# Patient Record
Sex: Male | Born: 2000 | Race: White | Hispanic: No
Health system: Southern US, Community
[De-identification: ages and names within clinical notes are randomized; demographics above are authoritative.]

## PROBLEM LIST (undated history)

## (undated) DIAGNOSIS — F209 Schizophrenia, unspecified: Secondary | ICD-10-CM

## (undated) DIAGNOSIS — F4320 Adjustment disorder, unspecified: Secondary | ICD-10-CM

## (undated) DIAGNOSIS — I469 Cardiac arrest, cause unspecified: Secondary | ICD-10-CM

## (undated) DIAGNOSIS — F1910 Other psychoactive substance abuse, uncomplicated: Secondary | ICD-10-CM

## (undated) HISTORY — PX: CIRCUMCISION: SUR203

---
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
## 2022-03-14 ENCOUNTER — Encounter (HOSPITAL_COMMUNITY): Payer: Self-pay | Admitting: Emergency Medicine

## 2022-03-14 ENCOUNTER — Other Ambulatory Visit: Payer: Self-pay

## 2022-03-14 ENCOUNTER — Emergency Department (HOSPITAL_COMMUNITY)
Admission: EM | Admit: 2022-03-14 | Discharge: 2022-03-14 | Disposition: A | Discharge status: Home / Self Care | Payer: Medicaid Other | Attending: Emergency Medicine | Admitting: Emergency Medicine

## 2022-03-14 DIAGNOSIS — F1910 Other psychoactive substance abuse, uncomplicated: Secondary | ICD-10-CM | POA: Diagnosis present

## 2022-03-14 NOTE — ED Triage Notes (Addendum)
Pt BIB EMS from [employer] stating that he used meth, cocaine, and heroin prior to calling for EMS. Pt states he wants detox from meth. Pt refused everything except transport to the hospital. Pt requesting food prior to triage being complete.

## 2022-03-14 NOTE — ED Provider Notes (Signed)
[facility] DEPT Provider Note   CSN: [id number] Arrival date & time: 03/14/22  0509     History  Chief Complaint  Patient presents with   Drug Problem    Nicholas Vega is a 21 y.o. male with a history of polysubstance use and depression who presents to the emergency department via EMS reporting that he has a drug problem.  When asked patient what brought him into the emergency department tonight he states that he utilized meth, crack, and heroin via injection.  He last used this prior to midnight.  He states he would like something to eat and was cold.  He has no additional complaints at this time.  No alleviating or aggravating factors.  He denies SI, HI, hallucinations, chest pain, shortness of breath, headache, or abdominal pain.  Per EMS they received a call from [employer] due to concern that patient was utilizing drugs, patient uncooperative in route.  HPI     Home Medications Prior to Admission medications   Medication Sig Start Date End Date Taking? Authorizing Provider  divalproex (DEPAKOTE ER) 500 MG 24 hr tablet Take 2 tablets (1,000 mg total) by mouth at bedtime. 07/30/21   Hill, Jackie Plum, MD  nicotine (NICODERM CQ - DOSED IN MG/24 HOURS) 21 mg/24hr patch Place 1 patch (21 mg total) onto the skin daily. 07/30/21   Maida Sale, MD  nicotine polacrilex (NICORETTE) 2 MG gum Take 1 each (2 mg total) by mouth as needed for smoking cessation. 07/30/21   Maida Sale, MD  OLANZapine (ZYPREXA) 10 MG tablet Take 10 mg by mouth at bedtime. Patient not taking: No sig reported 07/09/21   [provider]  thiamine 100 MG tablet Take 1 tablet (100 mg total) by mouth daily. 07/31/21   Maida Sale, MD  traZODone (DESYREL) 50 MG tablet Take 1 tablet (50 mg total) by mouth at bedtime as needed for sleep. 07/30/21   Maida Sale, MD      Allergies    Patient has no known allergies.    Review of Systems    Review of Systems  Constitutional:  Negative for chills and fever.  Respiratory:  Negative for shortness of breath.   Cardiovascular:  Negative for chest pain.  Gastrointestinal:  Negative for abdominal pain and vomiting.  Psychiatric/Behavioral:  Negative for hallucinations and suicidal ideas.   All other systems reviewed and are negative.   Physical Exam Updated Vital Signs BP (!) 129/58   Pulse (!) 59   Temp (!) 97.5 F (36.4 C)   Resp 18   Ht 5\' 8"  (1.727 m)   Wt 61.2 kg   SpO2 100%   BMI 20.53 kg/m  Physical Exam Vitals and nursing note reviewed.  Constitutional:      General: He is not in acute distress.    Appearance: He is well-developed. He is not toxic-appearing.  HENT:     Head: Normocephalic and atraumatic.  Eyes:     General:        Right eye: No discharge.        Left eye: No discharge.     Conjunctiva/sclera: Conjunctivae normal.  Cardiovascular:     Rate and Rhythm: Normal rate and regular rhythm.  Pulmonary:     Effort: No respiratory distress.     Breath sounds: Normal breath sounds. No wheezing or rales.  Abdominal:     General: There is no distension.     Palpations: Abdomen is soft.  Tenderness: There is no abdominal tenderness.  Musculoskeletal:     Cervical back: Neck supple.  Skin:    General: Skin is warm and dry.     Comments: Bruises and track marks present.  No obvious areas of increased erythema, warmth, purulence, induration or fluctuance.  Neurological:     Mental Status: He is alert.     Comments: Clear speech.   Psychiatric:        Thought Content: Thought content does not include homicidal or suicidal ideation.     Comments: Speech is mildly rapid however coherent and directed.  Patient does not appear to be responding to internal stimuli.     ED Results / Procedures / Treatments   Labs (all labs ordered are listed, but only abnormal results are displayed) Labs Reviewed - No data to display  EKG None  Radiology No  results found.  Procedures Procedures    Medications Ordered in ED Medications - No data to display  ED Course/ Medical Decision Making/ A&P                           Medical Decision Making  Patient presents to the emergency department via EMS requesting something to eat and reporting problems with substance use.  He is nontoxic, his vitals are not significantly abnormal, mildly bradycardic on arrival/intermittently however normal heart rate on my exam.  Temp 97.5, is coming from outside.  Patient has no specific complaints on exam.  He is not suicidal or homicidal, and does not appear to be responding to internal stimuli.  Does not appear acutely psychotic.  He has no specific physical complaints.  He has a relatively benign physical exam.  He was given the opportunity to take a nap under warm blankets in the emergency department with something to eat.  He overall appears appropriate for discharge.  Consulted TOC in order to try to coordinate peer support.  Resource guide provided in terms of detox. I discussed treatment plan, need for follow-up, and return precautions with the patient. Provided opportunity for questions, patient confirmed understanding and is in agreement with plan.    Final Clinical Impression(s) / ED Diagnoses Final diagnoses:  Polysubstance abuse [facility])    Rx / DC Orders ED Discharge Orders     None         Amaryllis Dyke, PA-C 03/14/22 [id number]    Shanon Rosser, MD 03/14/22 [phone]

## 2022-03-14 NOTE — Discharge Instructions (Signed)
Please follow-up with one of the outpatient resources in regards to your substance use.  Please try to avoid using drugs.  Follow-up with one of the resources as well as primary care, he did not have a primary care provider provided information for the [facility] health community wellness clinic.  Return to the emergency department for new or worsening symptoms or any other concerns.

## 2022-03-18 ENCOUNTER — Encounter (HOSPITAL_COMMUNITY): Payer: Self-pay

## 2022-03-18 ENCOUNTER — Emergency Department (EMERGENCY_DEPARTMENT_HOSPITAL)
Admission: EM | Admit: 2022-03-18 | Discharge: 2022-03-18 | Disposition: A | Discharge status: Other Acute Inpatient Hospital | Payer: Medicaid Other | Source: Home / Self Care | Attending: Emergency Medicine | Admitting: Emergency Medicine

## 2022-03-18 ENCOUNTER — Other Ambulatory Visit: Payer: Self-pay

## 2022-03-18 ENCOUNTER — Emergency Department (HOSPITAL_COMMUNITY)
Admission: EM | Admit: 2022-03-18 | Discharge: 2022-03-18 | Discharge status: Against Medical Advice | Payer: Medicaid Other | Attending: Emergency Medicine | Admitting: Emergency Medicine

## 2022-03-18 ENCOUNTER — Encounter (HOSPITAL_COMMUNITY): Payer: Self-pay | Admitting: Emergency Medicine

## 2022-03-18 ENCOUNTER — Ambulatory Visit (HOSPITAL_COMMUNITY)
Admission: EM | Admit: 2022-03-18 | Discharge: 2022-03-19 | Disposition: A | Discharge status: Home / Self Care | Payer: Medicaid Other | Source: Home / Self Care

## 2022-03-18 DIAGNOSIS — F1994 Other psychoactive substance use, unspecified with psychoactive substance-induced mood disorder: Secondary | ICD-10-CM | POA: Diagnosis present

## 2022-03-18 DIAGNOSIS — R45851 Suicidal ideations: Secondary | ICD-10-CM | POA: Insufficient documentation

## 2022-03-18 DIAGNOSIS — F1914 Other psychoactive substance abuse with psychoactive substance-induced mood disorder: Secondary | ICD-10-CM | POA: Insufficient documentation

## 2022-03-18 DIAGNOSIS — R451 Restlessness and agitation: Secondary | ICD-10-CM | POA: Diagnosis present

## 2022-03-18 DIAGNOSIS — M79673 Pain in unspecified foot: Secondary | ICD-10-CM | POA: Insufficient documentation

## 2022-03-18 DIAGNOSIS — Z5900 Homelessness unspecified: Secondary | ICD-10-CM | POA: Insufficient documentation

## 2022-03-18 DIAGNOSIS — F99 Mental disorder, not otherwise specified: Secondary | ICD-10-CM | POA: Diagnosis not present

## 2022-03-18 DIAGNOSIS — F339 Major depressive disorder, recurrent, unspecified: Secondary | ICD-10-CM | POA: Insufficient documentation

## 2022-03-18 DIAGNOSIS — F332 Major depressive disorder, recurrent severe without psychotic features: Secondary | ICD-10-CM | POA: Diagnosis not present

## 2022-03-18 DIAGNOSIS — Z20822 Contact with and (suspected) exposure to covid-19: Secondary | ICD-10-CM | POA: Insufficient documentation

## 2022-03-18 DIAGNOSIS — Z1339 Encounter for screening examination for other mental health and behavioral disorders: Secondary | ICD-10-CM | POA: Insufficient documentation

## 2022-03-18 DIAGNOSIS — Z5321 Procedure and treatment not carried out due to patient leaving prior to being seen by health care provider: Secondary | ICD-10-CM | POA: Insufficient documentation

## 2022-03-18 DIAGNOSIS — F1910 Other psychoactive substance abuse, uncomplicated: Secondary | ICD-10-CM | POA: Insufficient documentation

## 2022-03-18 LAB — CBC WITH DIFFERENTIAL/PLATELET
Abs Immature Granulocytes: 0.02 10*3/uL (ref 0.00–0.07)
Basophils Absolute: 0.1 10*3/uL (ref 0.0–0.1)
Basophils Relative: 1 %
Eosinophils Absolute: 0.4 10*3/uL (ref 0.0–0.5)
Eosinophils Relative: 5 %
HCT: 41.8 % (ref 39.0–52.0)
Hemoglobin: 14.1 g/dL (ref 13.0–17.0)
Immature Granulocytes: 0 %
Lymphocytes Relative: 29 %
Lymphs Abs: 2.2 10*3/uL (ref 0.7–4.0)
MCH: 30.6 pg (ref 26.0–34.0)
MCHC: 33.7 g/dL (ref 30.0–36.0)
MCV: 90.7 fL (ref 80.0–100.0)
Monocytes Absolute: 0.9 10*3/uL (ref 0.1–1.0)
Monocytes Relative: 12 %
Neutro Abs: 4.2 10*3/uL (ref 1.7–7.7)
Neutrophils Relative %: 53 %
Platelets: 301 10*3/uL (ref 150–400)
RBC: 4.61 MIL/uL (ref 4.22–5.81)
RDW: 14.3 % (ref 11.5–15.5)
WBC: 7.8 10*3/uL (ref 4.0–10.5)
nRBC: 0 % (ref 0.0–0.2)

## 2022-03-18 LAB — ETHANOL: Alcohol, Ethyl (B): <10 mg/dL (ref <10)

## 2022-03-18 LAB — COMPREHENSIVE METABOLIC PANEL
ALT: 28 U/L (ref 0–44)
AST: 30 U/L (ref 15–41)
Albumin: 3.7 g/dL (ref 3.5–5.0)
Alkaline Phosphatase: 47 U/L (ref 38–126)
Anion gap: 7 (ref 5–15)
BUN: 13 mg/dL (ref 6–20)
CO2: 26 mmol/L (ref 22–32)
Calcium: 8.8 mg/dL — ABNORMAL LOW (ref 8.9–10.3)
Chloride: 107 mmol/L (ref 98–111)
Creatinine, Ser: 0.58 mg/dL — ABNORMAL LOW (ref 0.61–1.24)
GFR, Estimated: >60 mL/min (ref >60)
Glucose, Bld: 78 mg/dL (ref 70–99)
Potassium: 4.1 mmol/L (ref 3.5–5.1)
Sodium: 140 mmol/L (ref 135–145)
Total Bilirubin: 0.6 mg/dL (ref 0.3–1.2)
Total Protein: 7.2 g/dL (ref 6.5–8.1)

## 2022-03-18 LAB — RAPID URINE DRUG SCREEN, HOSP PERFORMED
Amphetamines: POSITIVE — AB
Barbiturates: NOT DETECTED
Benzodiazepines: NOT DETECTED
Cocaine: NOT DETECTED
Opiates: NOT DETECTED
Tetrahydrocannabinol: POSITIVE — AB

## 2022-03-18 LAB — RESP PANEL BY RT-PCR (FLU A&B, COVID) ARPGX2
Influenza A by PCR: NEGATIVE
Influenza B by PCR: NEGATIVE
SARS Coronavirus 2 by RT PCR: NEGATIVE

## 2022-03-18 LAB — HIV ANTIBODY (ROUTINE TESTING W REFLEX): HIV Screen 4th Generation wRfx: NONREACTIVE

## 2022-03-18 LAB — ACETAMINOPHEN LEVEL: Acetaminophen (Tylenol), Serum: <10 ug/mL — ABNORMAL LOW (ref 10–30)

## 2022-03-18 LAB — SALICYLATE LEVEL: Salicylate Lvl: <7 mg/dL — ABNORMAL LOW (ref 7.0–30.0)

## 2022-03-18 MED ORDER — TRAZODONE HCL 100 MG PO TABS: 50.0000 mg | ORAL_TABLET | Freq: Every day | ORAL | Status: DC

## 2022-03-18 NOTE — ED Notes (Signed)
Attempted to call report to [facility], staff state there are no nurses available at this time and request call back in 15 minutes.

## 2022-03-18 NOTE — ED Triage Notes (Signed)
Patient states he needs a couple days in a bed since he has been sleeping on a trash bag outside in the rain. Patient states he has suicidal tendencies and struggles with thoughts daily but would not act on them . Patient requesting help since he is homeless and hungry.

## 2022-03-18 NOTE — Discharge Instructions (Addendum)
Residential Substance Abuse Treatment:  [facility]: [address]  [place], [place] [address] Telephone: [phone] (Present there at 7:45am to be assessed)  [facility]) [address]  [place] [address] [phone] Call at 8:30am tomorrow morning .  They report having availability.   Homeless Shelters: [facility] 7096827815 Call after residential treatment they help ages 71-21 with homelessness and can provide assistance with securing an apartment and etc.  [facility] [address]  [place], [place]  [phone] Also assist with homelessness, showering, food, and etc.  [facility] [address] [place], [place]  [phone] (call to be assessed)   Discharge recommendations:  Patient is to take medications as prescribed. Please see information for follow-up appointment with psychiatry and therapy. Please follow up with your primary care provider for all medical related needs.   Therapy: We recommend that patient participate in individual therapy to address mental health concerns.  Safety:  The patient should abstain from use of illicit substances/drugs and abuse of any medications. If symptoms worsen or do not continue to improve or if the patient becomes actively suicidal or homicidal then it is recommended that the patient return to the closest hospital emergency department, the [facility], or call 911 for further evaluation and treatment. National Suicide Prevention Lifeline 1-800-SUICIDE or (385)101-8320.  About 988 988 offers 24/7 access to trained crisis counselors who can help people experiencing mental health-related distress. People can call or text 988 or chat 988lifeline.org for themselves or if they are worried about a loved one who may need crisis support.

## 2022-03-18 NOTE — Consult Note (Signed)
[facility] ED ASSESSMENT   Reason for Consult:  Psychiatry evaluation Referring Physician:  ER Physician Patient Identification: Nicholas Vega MRN:  [id number] ED Chief Complaint: MDD (major depressive disorder), recurrent episode ([place])  Diagnosis:  Principal Problem:   MDD (major depressive disorder), recurrent episode ([place]) Active Problems:   Foot arch pain   Substance induced mood disorder [facility])   ED Assessment Time Calculation: Start Time: 2003 Stop Time: 2025 Total Time in Minutes (Assessment Completion): 22   Subjective:   Nicholas Vega is a 21 y.o. male patient admitted with hx of Polysubstance abuse, Depression, PTSD and ADHD seen this evening in the ER for .suicidal ideation secondary to homelessness.  Patient was hospitalized in [facility] last year October.  Marland Kitchen  HPI:  21 y.o. male patient admitted with hx of Polysubstance abuse, Depression, PTSD and ADHD seen this evening in the ER for .suicidal ideation secondary to homelessness.  [facility].  Patient reported that he has been sleeping on the floor under the rain, has had nothing to eat and that he is sleep deprived.  Patient threatened that he will do something to himself if allowed to leave tonight.  Patient admitted Methamphetamine , Cocaine use in the past five days.  He reported that he was hearing voices and seeing things  in the past three days but not now.  He is disheveled, irritable with labile mood.  We will keep him over  night and reevaluate in am.  He has not been taking any of his Psychotropic and only remembers Trazodone as his medications.   Past Psychiatric History: ADHD, Poly substance abuse, MDD, PTSD. Hospitalized last year October at [facility].  Frequent use of ER for care.  Self reported previousy attempted to hang himself a while back  Risk to Self or Others: Is the patient at risk to self? Yes Has the patient been a risk to self in the past 6 months? Yes Has the patient been a risk  to self within the distant past? Yes Is the patient a risk to others? No Has the patient been a risk to others in the past 6 months? No Has the patient been a risk to others within the distant past? No  [place] Scale:  [place] ED from 03/18/2022 in [place] DEPT Most recent reading at 03/18/2022  4:04 PM ED from 03/18/2022 in [place] DEPT Most recent reading at 03/18/2022  5:03 AM ED from 03/14/2022 in [place] DEPT Most recent reading at 03/14/2022  5:19 AM  C-SSRS RISK CATEGORY Moderate Risk No Risk No Risk       AIMS:  , , ,  ,   ASAM:    Substance Abuse:     Past Medical History:  Past Medical History:  Diagnosis Date   Adjustment disorder     Past Surgical History:  Procedure Laterality Date   ANKLE CLOSED REDUCTION Right 01/16/2016   Procedure: [place];  Surgeon: Renette Butters, MD;  Location: [place];  Service: Orthopedics;  Laterality: Right;   CIRCUMCISION     PERCUTANEOUS PINNING Right 01/16/2016   Procedure: PERCUTANEOUS PINNING EXTREMITY;  Surgeon: Renette Butters, MD;  Location: [place];  Service: Orthopedics;  Laterality: Right;   Family History:  Family History  Problem Relation Age of Onset   Cancer Sister        Cervical   Cancer Maternal Grandmother  ovarian, Cervical   Hypertension Maternal Grandfather    Hypertension Paternal Grandfather    Diabetes Maternal Aunt    Alcohol abuse Neg Hx    Arthritis Neg Hx    Asthma Neg Hx    Birth defects Neg Hx    COPD Neg Hx    Depression Neg Hx    Drug abuse Neg Hx    Early death Neg Hx    Hearing loss Neg Hx    Heart disease Neg Hx    Hyperlipidemia Neg Hx    Kidney disease Neg Hx    Learning disabilities Neg Hx    Mental illness Neg Hx    Mental retardation Neg Hx    Miscarriages / Stillbirths Neg Hx    Stroke Neg Hx    Vision loss Neg Hx    Varicose Veins Neg Hx    Family  Psychiatric  History: Dad-schizophrenia; mom and maternal grandparents-ADHD Social History:  Social History   Substance and Sexual Activity  Alcohol Use None     Social History   Substance and Sexual Activity  Drug Use Yes   Types: Methamphetamines, Amphetamines, Cocaine, IV    Social History   Socioeconomic History   Marital status: Single    Spouse name: Not on file   Number of children: Not on file   Years of education: Not on file   Highest education level: Not on file  Occupational History   Not on file  Tobacco Use   Smoking status: Never   Smokeless tobacco: Never  Substance and Sexual Activity   Alcohol use: Not on file   Drug use: Yes    Types: Methamphetamines, Amphetamines, Cocaine, IV   Sexual activity: Not on file  Other Topics Concern   Not on file  Social History Narrative   ** Merged History Encounter **       Social Determinants of Health   Financial Resource Strain: Not on file  Food Insecurity: Not on file  Transportation Needs: Not on file  Physical Activity: Not on file  Stress: Not on file  Social Connections: Not on file   Additional Social History:    Allergies:   Allergies  Allergen Reactions   Red Dye    Strawberry Extract     Labs:  Results for orders placed or performed during the hospital encounter of 03/18/22 (from the past 48 hour(s))  Urine rapid drug screen (hosp performed)     Status: Abnormal   Collection Time: 03/18/22  4:33 PM  Result Value Ref Range   Opiates NONE DETECTED NONE DETECTED   Cocaine NONE DETECTED NONE DETECTED   Benzodiazepines NONE DETECTED NONE DETECTED   Amphetamines POSITIVE (A) NONE DETECTED   Tetrahydrocannabinol POSITIVE (A) NONE DETECTED   Barbiturates NONE DETECTED NONE DETECTED    Comment: (NOTE) DRUG SCREEN FOR MEDICAL PURPOSES ONLY.  IF CONFIRMATION IS NEEDED FOR ANY PURPOSE, NOTIFY LAB WITHIN 5 DAYS.  LOWEST DETECTABLE LIMITS FOR URINE DRUG SCREEN Drug Class                      Cutoff (ng/mL) Amphetamine and metabolites    1000 Barbiturate and metabolites    200 Benzodiazepine                 [id number] Tricyclics and metabolites     300 Opiates and metabolites        300 Cocaine and metabolites        300 THC  50 Performed at [facility], [place] [address], [place], [place] [address]   Comprehensive metabolic panel     Status: Abnormal   Collection Time: 03/18/22  5:04 PM  Result Value Ref Range   Sodium 140 135 - 145 mmol/L   Potassium 4.1 3.5 - 5.1 mmol/L   Chloride 107 98 - 111 mmol/L   CO2 26 22 - 32 mmol/L   Glucose, Bld 78 70 - 99 mg/dL    Comment: Glucose reference range applies only to samples taken after fasting for at least 8 hours.   BUN 13 6 - 20 mg/dL   Creatinine, Ser 0.58 (L) 0.61 - 1.24 mg/dL   Calcium 8.8 (L) 8.9 - 10.3 mg/dL   Total Protein 7.2 6.5 - 8.1 g/dL   Albumin 3.7 3.5 - 5.0 g/dL   AST 30 15 - 41 U/L   ALT 28 0 - 44 U/L   Alkaline Phosphatase 47 38 - 126 U/L   Total Bilirubin 0.6 0.3 - 1.2 mg/dL   GFR, Estimated >60 >60 mL/min    Comment: (NOTE) Calculated using the CKD-EPI Creatinine Equation (2021)    Anion gap 7 5 - 15    Comment: Performed at [facility], [place] [address] [place], [place] [address]  CBC with Diff     Status: None   Collection Time: 03/18/22  5:04 PM  Result Value Ref Range   WBC 7.8 4.0 - 10.5 K/uL   RBC 4.61 4.22 - 5.81 MIL/uL   Hemoglobin 14.1 13.0 - 17.0 g/dL   HCT 41.8 39.0 - 52.0 %   MCV 90.7 80.0 - 100.0 fL   MCH 30.6 26.0 - 34.0 pg   MCHC 33.7 30.0 - 36.0 g/dL   RDW 14.3 11.5 - 15.5 %   Platelets 301 150 - 400 K/uL   nRBC 0.0 0.0 - 0.2 %   Neutrophils Relative % 53 %   Neutro Abs 4.2 1.7 - 7.7 K/uL   Lymphocytes Relative 29 %   Lymphs Abs 2.2 0.7 - 4.0 K/uL   Monocytes Relative 12 %   Monocytes Absolute 0.9 0.1 - 1.0 K/uL   Eosinophils Relative 5 %   Eosinophils Absolute 0.4 0.0 - 0.5 K/uL   Basophils Relative 1 %   Basophils  Absolute 0.1 0.0 - 0.1 K/uL   Immature Granulocytes 0 %   Abs Immature Granulocytes 0.02 0.00 - 0.07 K/uL    Comment: Performed at [facility], [place] [address] [place], [place] [address]  Ethanol     Status: None   Collection Time: 03/18/22  5:05 PM  Result Value Ref Range   Alcohol, Ethyl (B) <10 <10 mg/dL    Comment: (NOTE) Lowest detectable limit for serum alcohol is 10 mg/dL.  For medical purposes only. Performed at [facility], [place] [address], [place], [place] [id number]   Salicylate level     Status: Abnormal   Collection Time: 03/18/22  5:05 PM  Result Value Ref Range   Salicylate Lvl Q000111Q (L) 7.0 - 30.0 mg/dL    Comment: Performed at [facility], [place] [address], [place], [place] [address]  Acetaminophen level     Status: Abnormal   Collection Time: 03/18/22  5:05 PM  Result Value Ref Range   Acetaminophen (Tylenol), Serum <10 (L) 10 - 30 ug/mL    Comment: (NOTE) Therapeutic concentrations vary significantly. A range of 10-30 ug/mL  may be an effective concentration for many  patients. However, some  are best treated at concentrations outside of this range. Acetaminophen concentrations >150 ug/mL at 4 hours after ingestion  and >50 ug/mL at 12 hours after ingestion are often associated with  toxic reactions.  Performed at [facility], [place] [address], [place], [place] [address]     Current Facility-Administered Medications  Medication Dose Route Frequency Provider Last Rate Last Admin   traZODone (DESYREL) tablet 50 mg  50 mg Oral QHS Charmaine Downs C, NP       Current Outpatient Medications  Medication Sig Dispense Refill   divalproex (DEPAKOTE ER) 500 MG 24 hr tablet Take 2 tablets (1,000 mg total) by mouth at bedtime. 60 tablet 0   nicotine (NICODERM CQ - DOSED IN MG/24 HOURS) 21 mg/24hr patch Place 1 patch (21 mg total) onto the skin daily. 28 patch 0   nicotine polacrilex  (NICORETTE) 2 MG gum Take 1 each (2 mg total) by mouth as needed for smoking cessation. 100 tablet 0   OLANZapine (ZYPREXA) 10 MG tablet Take 10 mg by mouth at bedtime. (Patient not taking: No sig reported)     thiamine 100 MG tablet Take 1 tablet (100 mg total) by mouth daily. 30 tablet 0   traZODone (DESYREL) 50 MG tablet Take 1 tablet (50 mg total) by mouth at bedtime as needed for sleep. 30 tablet 0    Musculoskeletal: Strength & Muscle Tone: within normal limits Gait & Station: normal Patient leans: Front   Psychiatric Specialty Exam: Presentation  General Appearance: Disheveled (ill looking)  Eye Contact:Minimal  Speech:Clear and Coherent; Pressured  Speech Volume:Increased  Handedness:Right   Mood and Affect  Mood:Anxious; Irritable; Labile  Affect:Congruent   Thought Process  Thought Processes:Coherent; Goal Directed; Linear  Descriptions of Associations:Intact  Orientation:Full (Time, Place and Person)  Thought Content:Logical  History of Schizophrenia/Schizoaffective disorder:No  Duration of Psychotic Symptoms:Less than six months  Hallucinations:Hallucinations: None  Ideas of Reference:None  Suicidal Thoughts:Suicidal Thoughts: Yes, Passive  Homicidal Thoughts:Homicidal Thoughts: No   Sensorium  Memory:Immediate Good; Recent Good; Remote Good  Judgment:Poor  Insight:Poor   Executive Functions  Concentration:Fair  Attention Span:Fair  Recall:Good  Fund of Knowledge:Fair  Language:Fair   Psychomotor Activity  Psychomotor Activity:Psychomotor Activity: Normal   Assets  Assets:Communication Skills    Sleep  Sleep:Sleep: Fair   Physical Exam: Physical Exam Constitutional:      Appearance: Normal appearance.  HENT:     Head: Normocephalic and atraumatic.     Nose: Nose normal.  Cardiovascular:     Rate and Rhythm: Normal rate and regular rhythm.  Pulmonary:     Effort: Pulmonary effort is normal.  Musculoskeletal:         General: Normal range of motion.     Cervical back: Normal range of motion.  Skin:    General: Skin is warm and dry.  Neurological:     General: No focal deficit present.     Mental Status: He is alert and oriented to person, place, and time.    Review of Systems  Constitutional: Negative.   HENT: Negative.    Eyes: Negative.   Respiratory: Negative.    Cardiovascular: Negative.   Gastrointestinal: Negative.   Genitourinary: Negative.   Musculoskeletal: Negative.   Skin: Negative.   Neurological: Negative.   Endo/Heme/Allergies: Negative.   Psychiatric/Behavioral:  Positive for depression, substance abuse and suicidal ideas.    Blood pressure 133/60, pulse 84, temperature 97.9 F (36.6 C), temperature source Oral, resp. rate 18,  height 5\' 8"  (1.727 m), weight 61.2 kg, SpO2 97 %. Body mass index is 20.53 kg/m.  Medical Decision Making: Patient is threatening to harm himself if left to leave.  This threat could be due his being homeless.  He will be kept over night and reevaluated in am.  Problem 1: Recurrent MDD, severe without psychotic features  Problem 2: Polysubstance abuse  Problem 3: Homelessness.  Disposition:  Re-evaluate in am.  Delfin Gant, NP-PMHNP-BC 03/18/2022 8:27 PM

## 2022-03-18 NOTE — ED Notes (Signed)
Pt will not stay in the triage room and is getting agitated because he is not allowed to eat until seeing a provider.

## 2022-03-18 NOTE — ED Notes (Signed)
Safe transport contacted for patient transport to [facility].

## 2022-03-18 NOTE — ED Notes (Signed)
Patient called for room placement x2 without answer. 

## 2022-03-18 NOTE — ED Provider Triage Note (Signed)
Emergency Medicine Provider Triage Evaluation Note  Nicholas Vega , a 21 y.o. male  was evaluated in triage.  Pt complains of suicidal thoughts.  States he is homeless.  He feels like he wants to overdose and intent to harm himself.  States he has been inpatient previously for suicidal thoughts or cannot member the last time.  No nausea or vomiting.  He would also like a test to see if he is HIV positive.  Review of Systems  Positive: Suicidal thoughts Negative:   Physical Exam  BP 133/60 (BP Location: Left Arm)   Pulse 84   Temp 97.9 F (36.6 C) (Oral)   Resp 18   SpO2 97%  Gen:   Awake, no distress   Resp:  Normal effort  MSK:   Moves extremities without difficulty  Other:    Medical Decision Making  Medically screening exam initiated at 4:00 PM.  Appropriate orders placed.  Nicholas Vega was informed that the remainder of the evaluation will be completed by another provider, this initial triage assessment does not replace that evaluation, and the importance of remaining in the ED until their evaluation is complete.  Suicidal ideation   Nicholas Vega A, PA-C 03/18/22 1601

## 2022-03-18 NOTE — ED Notes (Signed)
Patient called for room placement x1 without answer. 

## 2022-03-18 NOTE — ED Triage Notes (Signed)
Pt presents to ED, he states he is hungry and would like something to eat. When asked why he wants to be seen medically, pt states "I need a bed and some real food".

## 2022-03-18 NOTE — ED Provider Notes (Signed)
[facility]-EMERGENCY DEPT Provider Note   CSN: [id number] Arrival date & time: 03/18/22  1440     History  Chief Complaint  Patient presents with   Suicidal    [name] is a 21 y.o. male polysubstance use, SI here for evaluation of SI.  Patient states he is homeless.  Has been living out of a trash bag.  States he has intermittent thoughts of SI however today they have been more persistent.  States he has thoughts of overdosing.  No HI, AVH.  He denies any pain, vomiting.  HPI     Home Medications Prior to Admission medications   Medication Sig Start Date End Date Taking? Authorizing Provider  divalproex (DEPAKOTE ER) 500 MG 24 hr tablet Take 2 tablets (1,000 mg total) by mouth at bedtime. Patient not taking: Reported on 03/18/2022 07/30/21   Roselle LocusHill, Stephanie Leigh, MD  nicotine (NICODERM CQ - DOSED IN MG/24 HOURS) 21 mg/24hr patch Place 1 patch (21 mg total) onto the skin daily. Patient not taking: Reported on 03/18/2022 07/30/21   Roselle LocusHill, Stephanie Leigh, MD  nicotine polacrilex (NICORETTE) 2 MG gum Take 1 each (2 mg total) by mouth as needed for smoking cessation. Patient not taking: Reported on 03/18/2022 07/30/21   Roselle LocusHill, Stephanie Leigh, MD  OLANZapine (ZYPREXA) 10 MG tablet Take 10 mg by mouth at bedtime. Patient not taking: Reported on 07/16/2021 07/09/21   [provider]  thiamine 100 MG tablet Take 1 tablet (100 mg total) by mouth daily. Patient not taking: Reported on 03/18/2022 07/31/21   Roselle LocusHill, Stephanie Leigh, MD  traZODone (DESYREL) 50 MG tablet Take 1 tablet (50 mg total) by mouth at bedtime as needed for sleep. Patient not taking: Reported on 03/18/2022 07/30/21   Roselle LocusHill, Stephanie Leigh, MD      Allergies    Red dye and Strawberry extract    Review of Systems   Review of Systems  Constitutional: Negative.   HENT: Negative.    Respiratory: Negative.    Cardiovascular: Negative.   Gastrointestinal: Negative.   Genitourinary:  Negative.   Musculoskeletal: Negative.   Skin: Negative.   Psychiatric/Behavioral:  Positive for sleep disturbance and suicidal ideas. The patient is nervous/anxious.   All other systems reviewed and are negative.   Physical Exam Updated Vital Signs BP 133/60 (BP Location: Left Arm)   Pulse 84   Temp 97.9 F (36.6 C) (Oral)   Resp 18   Ht 5\' 8"  (1.727 m)   Wt 61.2 kg   SpO2 97%   BMI 20.53 kg/m  Physical Exam Vitals and nursing note reviewed.  Constitutional:      General: He is not in acute distress.    Appearance: He is well-developed. He is not ill-appearing, toxic-appearing or diaphoretic.  HENT:     Head: Normocephalic and atraumatic.  Eyes:     Pupils: Pupils are equal, round, and reactive to light.  Cardiovascular:     Rate and Rhythm: Normal rate and regular rhythm.     Pulses: Normal pulses.     Heart sounds: Normal heart sounds.  Pulmonary:     Effort: Pulmonary effort is normal. No respiratory distress.     Breath sounds: Normal breath sounds.  Abdominal:     General: Bowel sounds are normal. There is no distension.     Palpations: Abdomen is soft.  Musculoskeletal:        General: Normal range of motion.     Cervical back: Normal range of  motion and neck supple.  Skin:    General: Skin is warm and dry.     Capillary Refill: Capillary refill takes less than 2 seconds.  Neurological:     General: No focal deficit present.     Mental Status: He is alert and oriented to person, place, and time.     Cranial Nerves: Cranial nerves 2-12 are intact.     Sensory: Sensation is intact.     Motor: Motor function is intact.  Psychiatric:        Mood and Affect: Affect is flat.        Speech: Speech normal.        Behavior: Behavior normal. Behavior is cooperative.        Thought Content: Thought content includes suicidal ideation. Thought content includes suicidal plan.     Comments: Admits to SI with plan to overdose.  Denies HI, AVH. Cooperative, follows  commands Odd affect     ED Results / Procedures / Treatments   Labs (all labs ordered are listed, but only abnormal results are displayed) Labs Reviewed  COMPREHENSIVE METABOLIC PANEL - Abnormal; Notable for the following components:      Result Value   Creatinine, Ser 0.58 (*)    Calcium 8.8 (*)    All other components within normal limits  RAPID URINE DRUG SCREEN, HOSP PERFORMED - Abnormal; Notable for the following components:   Amphetamines POSITIVE (*)    Tetrahydrocannabinol POSITIVE (*)    All other components within normal limits  SALICYLATE LEVEL - Abnormal; Notable for the following components:   Salicylate Lvl <7.0 (*)    All other components within normal limits  ACETAMINOPHEN LEVEL - Abnormal; Notable for the following components:   Acetaminophen (Tylenol), Serum <10 (*)    All other components within normal limits  RESP PANEL BY RT-PCR (FLU A&B, COVID) ARPGX2  ETHANOL  CBC WITH DIFFERENTIAL/PLATELET  HIV ANTIBODY (ROUTINE TESTING W REFLEX)    EKG EKG Interpretation  Date/Time:  Monday March 18 2022 21:24:09 EDT Ventricular Rate:  77 PR Interval:  123 QRS Duration: 92 QT Interval:  364 QTC Calculation: 412 R Axis:   93 Text Interpretation: Sinus rhythm Borderline right axis deviation ST elev, probable normal early repol pattern since last tracing no significant change Confirmed by Mancel Bale [id number]) on 03/18/2022 10:19:27 PM  Radiology No results found.  Procedures Procedures    Medications Ordered in ED Medications  traZODone (DESYREL) tablet 50 mg (has no administration in time range)    ED Course/ Medical Decision Making/ A&P     21 year old history of polysubstance use here for evaluation of suicidal thoughts with plan to overdose on drugs.  He denies any EtOH use.  He is unsure when the last time he used drugs was.  Does state he is homeless and that suicidal thoughts are chronic in nature however today have worsened.  States he has  attempted suicide previously.  We will get medical clearance labs, consult with psychiatry for disposition.  Labs personally viewed and interpreted:  CBC without leukocytosis Metabolic panel without significant eval COVID-negative Ethanol, acetaminophen, salicylate within normal limits UDS positive for THC and amphetamines   Patient medically cleared.  Disposition per psychiatry.  Psychiatry has assessed patient.  See note. Agreeable for overnight observation at [facility].  Patient transferred to [facility], EMTALA filled out  Medical Decision Making Amount and/or Complexity of Data Reviewed External Data Reviewed: labs and notes. Labs: ordered. Decision-making details documented in ED Course.  Risk OTC drugs. Diagnosis or treatment significantly limited by social determinants of health.          Final Clinical Impression(s) / ED Diagnoses Final diagnoses:  Suicidal ideation  Polysubstance abuse [facility])    Rx / DC Orders ED Discharge Orders     None         Candela Krul A, PA-C 03/18/22 2307    Mancel Bale, MD 03/20/22 [phone]

## 2022-03-19 ENCOUNTER — Ambulatory Visit (HOSPITAL_COMMUNITY)
Admission: AD | Admit: 2022-03-19 | Discharge: 2022-03-19 | Disposition: A | Discharge status: Home / Self Care | Payer: Medicaid Other | Attending: Psychiatry | Admitting: Psychiatry

## 2022-03-19 ENCOUNTER — Emergency Department (HOSPITAL_COMMUNITY)
Admission: EM | Admit: 2022-03-19 | Discharge: 2022-03-19 | Disposition: A | Discharge status: Home / Self Care | Payer: Medicaid Other

## 2022-03-19 ENCOUNTER — Other Ambulatory Visit: Payer: Self-pay

## 2022-03-19 ENCOUNTER — Encounter (HOSPITAL_COMMUNITY): Payer: Self-pay

## 2022-03-19 DIAGNOSIS — Z5900 Homelessness unspecified: Secondary | ICD-10-CM

## 2022-03-19 NOTE — Progress Notes (Signed)
Date: 03/19/22 Time: 12:29am  [place] was provided with resources for residential treatment and homeless shelters nearby.  [place] was also given two buses to assist with getting to treatment/shelter.  All resources are listed in AVS.  Rhea Bleacher [facility] Coordinator.

## 2022-03-19 NOTE — ED Notes (Signed)
Pt left after getting food in waiting room

## 2022-03-19 NOTE — ED Provider Notes (Signed)
Behavioral Health Urgent [facility] Medical Screening Exam  Patient Name: Nicholas Vega MRN: [id number] Date of Evaluation: 03/19/22 Chief Complaint:  I've been homeless for two and a half months".  Diagnosis:  Final diagnoses:  Polysubstance abuse (HCC)  Homelessness    History of Present illness: Nicholas Vega is a 21 y.o. male who is evaluated tonight for complaints of homelessness. The patient reports " Lavenia Atlas been homeless for the past two and a half months, and sleeping on the ground".  The patient denies SI/HI/AVH.  The patient reports " Im 21, and have a child on the way, and no one is helping me". The patient adds " I'm using meth, heroin, crack, and smoking weed". The patient reports "I used meth four days ago". The patient reports he has been able to but his drugs by "I hold up a sign, panhandle, or ask other homeless people".  The patient adds "I have been in patient at a hospital a year and a half ago, and recently cause I got tired of sleeping out in the rain". The patient reports past suicidal ideations, and adds, but "I'm not suicidal right now".  The patient does not take any medications or see any mental health provider.  The patient reports he his mom is paralyzed and lives with his sister.  The patient reports " I didn't graduate high school because I went to jail. " I need a place to sleep, shower, eat, and set up work".    On evaluation, patient is alert, oriented x 3, and cooperative. Speech is clear, and coherent. Pt appears disheveled. Eye contact is minimal. Mood is anxious, affect is congruent with mood. Thought process and content is coherent/logical. Pt denies SI/HI/AVH. There is no indication that the patient is responding to internal stimuli. No delusions elicited during this assessment.    Psychiatric Specialty Exam  Presentation  General Appearance:Disheveled  Eye Contact:Minimal  Speech:Normal Rate  Speech Volume:Increased  Handedness:Right   Mood and  Affect  Mood:Anxious  Affect:Congruent   Thought Process  Thought Processes:Coherent  Descriptions of Associations:Intact  Orientation:Full (Time, Place and Person)  Thought Content:Logical  Diagnosis of Schizophrenia or Schizoaffective disorder in past: No  Duration of Psychotic Symptoms: Less than six months  Hallucinations:None  Ideas of Reference:None  Suicidal Thoughts:No  Homicidal Thoughts:No   Sensorium  Memory:Immediate Fair; Recent Fair  Judgment:Fair  Insight:Poor   Executive Functions  Concentration:Fair  Attention Span:Fair  Recall:Fair  Fund of Knowledge:Fair  Language:Fair   Psychomotor Activity  Psychomotor Activity:Normal   Assets  Assets:Communication Skills   Sleep  Sleep:Fair  Number of hours: 7.5   Nutritional Assessment (For OBS and [facility] admissions only) Has the patient had a weight loss or gain of 10 pounds or more in the last 3 months?: No Has the patient had a decrease in food intake/or appetite?: No Does the patient have dental problems?: No Does the patient have eating habits or behaviors that may be indicators of an eating disorder including binging or inducing vomiting?: No Has the patient recently lost weight without trying?: 0 Has the patient been eating poorly because of a decreased appetite?: 0 Malnutrition Screening Tool Score: 0    Physical Exam: Physical Exam Constitutional:      General: He is not in acute distress.    Appearance: He is not diaphoretic.  HENT:     Head: Normocephalic.     Right Ear: External ear normal.     Left Ear: External ear normal.  Nose: No congestion.  Eyes:     General:        Right eye: No discharge.  Pulmonary:     Effort: No respiratory distress.  Chest:     Chest wall: No tenderness.  Neurological:     Mental Status: He is alert and oriented to person, place, and time.  Psychiatric:        Attention and Perception: He does not perceive auditory or visual  hallucinations.        Mood and Affect: Mood is anxious. Mood is not depressed. Affect is not tearful.        Speech: Speech normal.        Behavior: Behavior is cooperative.        Thought Content: Thought content is not paranoid or delusional. Thought content does not include homicidal or suicidal ideation. Thought content does not include homicidal or suicidal plan.        Cognition and Memory: Cognition normal.    Review of Systems  Constitutional:  Negative for diaphoresis, fever and weight loss.  HENT:  Negative for congestion.   Eyes:  Negative for pain.  Respiratory:  Negative for cough and shortness of breath.   Cardiovascular:  Negative for chest pain and palpitations.  Gastrointestinal:  Negative for diarrhea, nausea and vomiting.  Neurological:  Negative for seizures, loss of consciousness, weakness and headaches.  Psychiatric/Behavioral:  Positive for substance abuse. Negative for depression, hallucinations and suicidal ideas. The patient is nervous/anxious.    Musculoskeletal: Strength & Muscle Tone: within normal limits Gait & Station: normal Patient leans: N/A   [facility] Discharge Disposition for Follow up and Recommendations: Residential substance abuse treatment program. Based on my evaluation, the patient does not have an emergency medical condition and will be discharged to follow up with residential substance abuse treatment program. SW provided patient with resources.   Randon Goldsmith, NP 03/19/2022, 12:26 AM
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
## 2022-04-15 ENCOUNTER — Telehealth (HOSPITAL_COMMUNITY): Payer: Self-pay

## 2022-04-15 NOTE — BH Assessment (Signed)
Care Management - [facility] Follow Up Discharges   Writer attempted to make contact with patient today and was unsuccessful.  Voicemail is not set up.    Per chart review, patient was provided with outpatient resources and homeless shelter resources.

## 2022-05-31 ENCOUNTER — Other Ambulatory Visit: Payer: Self-pay

## 2022-05-31 ENCOUNTER — Emergency Department (HOSPITAL_COMMUNITY): Payer: Medicaid Other

## 2022-05-31 ENCOUNTER — Encounter (HOSPITAL_COMMUNITY): Payer: Self-pay | Admitting: Emergency Medicine

## 2022-05-31 ENCOUNTER — Emergency Department (HOSPITAL_COMMUNITY)
Admission: EM | Admit: 2022-05-31 | Discharge: 2022-05-31 | Disposition: A | Discharge status: Home / Self Care | Payer: Medicaid Other | Attending: Emergency Medicine | Admitting: Emergency Medicine

## 2022-05-31 DIAGNOSIS — J40 Bronchitis, not specified as acute or chronic: Secondary | ICD-10-CM

## 2022-05-31 DIAGNOSIS — R059 Cough, unspecified: Secondary | ICD-10-CM | POA: Insufficient documentation

## 2022-05-31 DIAGNOSIS — R079 Chest pain, unspecified: Secondary | ICD-10-CM | POA: Insufficient documentation

## 2022-05-31 DIAGNOSIS — R0602 Shortness of breath: Secondary | ICD-10-CM | POA: Insufficient documentation

## 2022-05-31 DIAGNOSIS — H9209 Otalgia, unspecified ear: Secondary | ICD-10-CM | POA: Insufficient documentation

## 2022-05-31 DIAGNOSIS — R0981 Nasal congestion: Secondary | ICD-10-CM | POA: Insufficient documentation

## 2022-05-31 LAB — CBC
HCT: 45.4 % (ref 39.0–52.0)
Hemoglobin: 15.6 g/dL (ref 13.0–17.0)
MCH: 30.5 pg (ref 26.0–34.0)
MCHC: 34.4 g/dL (ref 30.0–36.0)
MCV: 88.8 fL (ref 80.0–100.0)
Platelets: 256 10*3/uL (ref 150–400)
RBC: 5.11 MIL/uL (ref 4.22–5.81)
RDW: 13.5 % (ref 11.5–15.5)
WBC: 16.5 10*3/uL — ABNORMAL HIGH (ref 4.0–10.5)
nRBC: 0 % (ref 0.0–0.2)

## 2022-05-31 LAB — BASIC METABOLIC PANEL
Anion gap: 9 (ref 5–15)
BUN: 9 mg/dL (ref 6–20)
CO2: 26 mmol/L (ref 22–32)
Calcium: 9.4 mg/dL (ref 8.9–10.3)
Chloride: 105 mmol/L (ref 98–111)
Creatinine, Ser: 0.73 mg/dL (ref 0.61–1.24)
GFR, Estimated: >60 mL/min (ref >60)
Glucose, Bld: 90 mg/dL (ref 70–99)
Potassium: 4.4 mmol/L (ref 3.5–5.1)
Sodium: 140 mmol/L (ref 135–145)

## 2022-05-31 LAB — TROPONIN I (HIGH SENSITIVITY): Troponin I (High Sensitivity): <2 ng/L (ref <18)

## 2022-05-31 NOTE — ED Triage Notes (Signed)
Pt reports chest pain, SHOB x 2 days.

## 2022-05-31 NOTE — ED Provider Triage Note (Signed)
Emergency Medicine Provider Triage Evaluation Note  Nicholas Vega , a 21 y.o. male  was evaluated in triage.  Pt complains of cough, congestion, fevers. States that same has been ongoing x 2 days. No known sick contacts. Endorses chest pain and shortness of breath. Pain is across his chest. No leg pain or leg swelling  Review of Systems  Positive:  Negative:  Physical Exam  BP (!) 132/58 (BP Location: Right Arm)   Pulse 100   Temp 98.5 F (36.9 C) (Oral)   Resp 18   SpO2 100%  Gen:   Awake, no distress   Resp:  Normal effort  MSK:   Moves extremities without difficulty  Other:    Medical Decision Making  Medically screening exam initiated at 4:39 PM.  Appropriate orders placed.  Nicholas Vega was informed that the remainder of the evaluation will be completed by another provider, this initial triage assessment does not replace that evaluation, and the importance of remaining in the ED until their evaluation is complete.     Silva Bandy, PA-C 05/31/22 1641

## 2022-05-31 NOTE — ED Provider Notes (Signed)
[facility]-EMERGENCY DEPT Provider Note   CSN: [id number] Arrival date & time: 05/31/22  1601     History  Chief Complaint  Patient presents with   Chest Pain   Cough   Shortness of Breath    ESAI STECKLEIN is a 21 y.o. male.  21 year old male presents with cough congestion URI symptoms x2 days.  Denies any fever or chills.  Endorses chest pain that is worse with deep breathing and coughing.  Denies any vomiting or diarrhea.  Has had some nasal congestion as well.  Ear pain.  No trouble swallowing.  Patient does use tobacco products and does vape.  No treatment use prior to arrival       Home Medications Prior to Admission medications   Medication Sig Start Date End Date Taking? Authorizing Provider  divalproex (DEPAKOTE ER) 500 MG 24 hr tablet Take 2 tablets (1,000 mg total) by mouth at bedtime. Patient not taking: Reported on 03/18/2022 07/30/21   Roselle Locus, MD  nicotine (NICODERM CQ - DOSED IN MG/24 HOURS) 21 mg/24hr patch Place 1 patch (21 mg total) onto the skin daily. Patient not taking: Reported on 03/18/2022 07/30/21   Roselle Locus, MD  nicotine polacrilex (NICORETTE) 2 MG gum Take 1 each (2 mg total) by mouth as needed for smoking cessation. Patient not taking: Reported on 03/18/2022 07/30/21   Roselle Locus, MD  OLANZapine (ZYPREXA) 10 MG tablet Take 10 mg by mouth at bedtime. Patient not taking: Reported on 07/16/2021 07/09/21   [provider]  thiamine 100 MG tablet Take 1 tablet (100 mg total) by mouth daily. Patient not taking: Reported on 03/18/2022 07/31/21   Roselle Locus, MD  traZODone (DESYREL) 50 MG tablet Take 1 tablet (50 mg total) by mouth at bedtime as needed for sleep. Patient not taking: Reported on 03/18/2022 07/30/21   Roselle Locus, MD      Allergies    Red dye and Strawberry extract    Review of Systems   Review of Systems  All other systems reviewed and are  negative.   Physical Exam Updated Vital Signs BP 124/75 (BP Location: Left Arm)   Pulse (!) 109   Temp 98.5 F (36.9 C) (Oral)   Resp 18   SpO2 96%  Physical Exam Vitals and nursing note reviewed.  Constitutional:      General: He is not in acute distress.    Appearance: Normal appearance. He is well-developed. He is not toxic-appearing.  HENT:     Head: Normocephalic and atraumatic.  Eyes:     General: Lids are normal.     Conjunctiva/sclera: Conjunctivae normal.     Pupils: Pupils are equal, round, and reactive to light.  Neck:     Thyroid: No thyroid mass.     Trachea: No tracheal deviation.  Cardiovascular:     Rate and Rhythm: Normal rate and regular rhythm.     Heart sounds: Normal heart sounds. No murmur heard.    No gallop.  Pulmonary:     Effort: Pulmonary effort is normal. No respiratory distress.     Breath sounds: Normal breath sounds. No stridor. No decreased breath sounds, wheezing, rhonchi or rales.  Abdominal:     General: There is no distension.     Palpations: Abdomen is soft.     Tenderness: There is no abdominal tenderness. There is no rebound.  Musculoskeletal:        General: No tenderness. Normal range of  motion.     Cervical back: Normal range of motion and neck supple.  Skin:    General: Skin is warm and dry.     Findings: No abrasion or rash.  Neurological:     Mental Status: He is alert and oriented to person, place, and time. Mental status is at baseline.     GCS: GCS eye subscore is 4. GCS verbal subscore is 5. GCS motor subscore is 6.     Cranial Nerves: No cranial nerve deficit.     Sensory: No sensory deficit.     Motor: Motor function is intact.  Psychiatric:        Attention and Perception: Attention normal.        Speech: Speech normal.        Behavior: Behavior normal.     ED Results / Procedures / Treatments   Labs (all labs ordered are listed, but only abnormal results are displayed) Labs Reviewed  CBC - Abnormal;  Notable for the following components:      Result Value   WBC 16.5 (*)    All other components within normal limits  RESP PANEL BY RT-PCR (FLU A&B, COVID) ARPGX2  BASIC METABOLIC PANEL  TROPONIN I (HIGH SENSITIVITY)  TROPONIN I (HIGH SENSITIVITY)    EKG EKG Interpretation  Date/Time:  Friday May 31 2022 16:23:43 EDT Ventricular Rate:  100 PR Interval:  125 QRS Duration: 78 QT Interval:  326 QTC Calculation: 421 R Axis:   88 Text Interpretation: Sinus tachycardia ST elev, probable normal early repol pattern Confirmed by Lorre Nick ([id number]) on 05/31/2022 7:54:55 PM  Radiology DG Chest 2 View  Result Date: 05/31/2022 CLINICAL DATA:  Chest pain and shortness of breath for 2 days. EXAM: CHEST - 2 VIEW COMPARISON:  None Available. FINDINGS: Central peribronchial thickening noted bilaterally. No evidence of pulmonary airspace disease or hyperinflation. No evidence of pleural effusion. Heart size is normal. IMPRESSION: Central peribronchial thickening. No evidence of pneumonia. Electronically Signed   By: Danae Orleans M.D.   On: 05/31/2022 17:05    Procedures Procedures    Medications Ordered in ED Medications - No data to display  ED Course/ Medical Decision Making/ A&P                           Medical Decision Making Amount and/or Complexity of Data Reviewed Labs: ordered. Radiology: ordered.   Patient's EKG shows sinus tachycardia per my interpretation.  Patient's chest x-ray per my interpretation is consistent with bronchitis.  Patient's symptoms consistent with likely viral URI.  Did have cardiac enzymes performed which were negative.  Low suspicion for ACS or myocarditis.  He is afebrile here.  He is not hypoxic.  Will discharge home        Final Clinical Impression(s) / ED Diagnoses Final diagnoses:  None    Rx / DC Orders ED Discharge Orders     None         Lorre Nick, MD 05/31/22 [phone]

## 2022-07-25 ENCOUNTER — Emergency Department (HOSPITAL_COMMUNITY): Payer: Medicaid Other

## 2022-07-25 ENCOUNTER — Other Ambulatory Visit: Payer: Self-pay

## 2022-07-25 ENCOUNTER — Observation Stay (HOSPITAL_COMMUNITY)
Admission: EM | Admit: 2022-07-25 | Discharge: 2022-07-26 | Disposition: A | Discharge status: Home / Self Care | Payer: Medicaid Other | Attending: Pulmonary Disease | Admitting: Pulmonary Disease

## 2022-07-25 ENCOUNTER — Encounter (HOSPITAL_COMMUNITY): Payer: Self-pay | Admitting: Pulmonary Disease

## 2022-07-25 DIAGNOSIS — J9602 Acute respiratory failure with hypercapnia: Secondary | ICD-10-CM | POA: Diagnosis not present

## 2022-07-25 DIAGNOSIS — I469 Cardiac arrest, cause unspecified: Secondary | ICD-10-CM | POA: Diagnosis not present

## 2022-07-25 DIAGNOSIS — E8729 Other acidosis: Secondary | ICD-10-CM | POA: Diagnosis not present

## 2022-07-25 DIAGNOSIS — J690 Pneumonitis due to inhalation of food and vomit: Secondary | ICD-10-CM | POA: Insufficient documentation

## 2022-07-25 DIAGNOSIS — N179 Acute kidney failure, unspecified: Secondary | ICD-10-CM | POA: Insufficient documentation

## 2022-07-25 DIAGNOSIS — Z79899 Other long term (current) drug therapy: Secondary | ICD-10-CM | POA: Insufficient documentation

## 2022-07-25 DIAGNOSIS — J9601 Acute respiratory failure with hypoxia: Secondary | ICD-10-CM | POA: Diagnosis not present

## 2022-07-25 DIAGNOSIS — Z5900 Homelessness unspecified: Secondary | ICD-10-CM | POA: Insufficient documentation

## 2022-07-25 DIAGNOSIS — T402X1A Poisoning by other opioids, accidental (unintentional), initial encounter: Secondary | ICD-10-CM | POA: Diagnosis present

## 2022-07-25 DIAGNOSIS — G929 Unspecified toxic encephalopathy: Secondary | ICD-10-CM | POA: Diagnosis not present

## 2022-07-25 DIAGNOSIS — J189 Pneumonia, unspecified organism: Secondary | ICD-10-CM

## 2022-07-25 DIAGNOSIS — R112 Nausea with vomiting, unspecified: Secondary | ICD-10-CM | POA: Diagnosis not present

## 2022-07-25 DIAGNOSIS — R680 Hypothermia, not associated with low environmental temperature: Secondary | ICD-10-CM | POA: Insufficient documentation

## 2022-07-25 HISTORY — DX: Other psychoactive substance abuse, uncomplicated: F19.10

## 2022-07-25 HISTORY — DX: Schizophrenia, unspecified: F20.9

## 2022-07-25 HISTORY — DX: Cardiac arrest, cause unspecified: I46.9

## 2022-07-25 LAB — CBC WITH DIFFERENTIAL/PLATELET
Abs Immature Granulocytes: 0.26 10*3/uL — ABNORMAL HIGH (ref 0.00–0.07)
Basophils Absolute: 0.1 10*3/uL (ref 0.0–0.1)
Basophils Relative: 0 %
Eosinophils Absolute: 0 10*3/uL (ref 0.0–0.5)
Eosinophils Relative: 0 %
HCT: 50.9 % (ref 39.0–52.0)
Hemoglobin: 16.7 g/dL (ref 13.0–17.0)
Immature Granulocytes: 2 %
Lymphocytes Relative: 9 %
Lymphs Abs: 1.4 10*3/uL (ref 0.7–4.0)
MCH: 30 pg (ref 26.0–34.0)
MCHC: 32.8 g/dL (ref 30.0–36.0)
MCV: 91.5 fL (ref 80.0–100.0)
Monocytes Absolute: 0.8 10*3/uL (ref 0.1–1.0)
Monocytes Relative: 5 %
Neutro Abs: 13.2 10*3/uL — ABNORMAL HIGH (ref 1.7–7.7)
Neutrophils Relative %: 84 %
Platelets: 344 10*3/uL (ref 150–400)
RBC: 5.56 MIL/uL (ref 4.22–5.81)
RDW: 14.4 % (ref 11.5–15.5)
WBC: 15.7 10*3/uL — ABNORMAL HIGH (ref 4.0–10.5)
nRBC: 0 % (ref 0.0–0.2)

## 2022-07-25 LAB — BLOOD GAS, VENOUS
Acid-Base Excess: 2.9 mmol/L — ABNORMAL HIGH (ref 0.0–2.0)
Acid-base deficit: 9 mmol/L — ABNORMAL HIGH (ref 0.0–2.0)
Bicarbonate: 25.9 mmol/L (ref 20.0–28.0)
Bicarbonate: 29.8 mmol/L — ABNORMAL HIGH (ref 20.0–28.0)
O2 Saturation: 29 %
O2 Saturation: 25.2 %
Patient temperature: 37
Patient temperature: 37
pCO2, Ven: 118 mmHg (ref 44–60)
pCO2, Ven: 54 mmHg (ref 44–60)
pH, Ven: 6.95 — CL (ref 7.25–7.43)
pH, Ven: 7.35 (ref 7.25–7.43)
pO2, Ven: <31 mmHg — CL (ref 32–45)
pO2, Ven: <31 mmHg — CL (ref 32–45)

## 2022-07-25 LAB — CBG MONITORING, ED: Glucose-Capillary: 231 mg/dL — ABNORMAL HIGH (ref 70–99)

## 2022-07-25 LAB — LACTIC ACID, PLASMA
Lactic Acid, Venous: 6.7 mmol/L (ref 0.5–1.9)
Lactic Acid, Venous: 1.8 mmol/L (ref 0.5–1.9)

## 2022-07-25 LAB — BASIC METABOLIC PANEL
Anion gap: 10 (ref 5–15)
Anion gap: 8 (ref 5–15)
BUN: 13 mg/dL (ref 6–20)
BUN: 12 mg/dL (ref 6–20)
CO2: 27 mmol/L (ref 22–32)
CO2: 24 mmol/L (ref 22–32)
Calcium: 8.9 mg/dL (ref 8.9–10.3)
Calcium: 7.6 mg/dL — ABNORMAL LOW (ref 8.9–10.3)
Chloride: 102 mmol/L (ref 98–111)
Chloride: 108 mmol/L (ref 98–111)
Creatinine, Ser: 1.32 mg/dL — ABNORMAL HIGH (ref 0.61–1.24)
Creatinine, Ser: 0.84 mg/dL (ref 0.61–1.24)
GFR, Estimated: >60 mL/min (ref >60)
GFR, Estimated: >60 mL/min (ref >60)
Glucose, Bld: 240 mg/dL — ABNORMAL HIGH (ref 70–99)
Glucose, Bld: 46 mg/dL — ABNORMAL LOW (ref 70–99)
Potassium: 4.8 mmol/L (ref 3.5–5.1)
Potassium: 4.2 mmol/L (ref 3.5–5.1)
Sodium: 139 mmol/L (ref 135–145)
Sodium: 140 mmol/L (ref 135–145)

## 2022-07-25 LAB — ETHANOL: Alcohol, Ethyl (B): <10 mg/dL (ref <10)

## 2022-07-25 LAB — SALICYLATE LEVEL: Salicylate Lvl: <7 mg/dL — ABNORMAL LOW (ref 7.0–30.0)

## 2022-07-25 LAB — HEPATIC FUNCTION PANEL
ALT: 15 U/L (ref 0–44)
AST: 24 U/L (ref 15–41)
Albumin: 4.7 g/dL (ref 3.5–5.0)
Alkaline Phosphatase: 84 U/L (ref 38–126)
Bilirubin, Direct: <0.1 mg/dL (ref 0.0–0.2)
Total Bilirubin: 0.5 mg/dL (ref 0.3–1.2)
Total Protein: 8.3 g/dL — ABNORMAL HIGH (ref 6.5–8.1)

## 2022-07-25 LAB — CK: Total CK: 102 U/L (ref 49–397)

## 2022-07-25 LAB — GLUCOSE, CAPILLARY: Glucose-Capillary: 63 mg/dL — ABNORMAL LOW (ref 70–99)

## 2022-07-25 LAB — MRSA NEXT GEN BY PCR, NASAL: MRSA by PCR Next Gen: NOT DETECTED

## 2022-07-25 LAB — ACETAMINOPHEN LEVEL: Acetaminophen (Tylenol), Serum: <10 ug/mL — ABNORMAL LOW (ref 10–30)

## 2022-07-25 LAB — AMMONIA: Ammonia: 95 umol/L — ABNORMAL HIGH (ref 9–35)

## 2022-07-25 MED ORDER — SODIUM BICARBONATE 8.4 % IV SOLN
100.0000 meq | Freq: Once | INTRAVENOUS | Status: AC
  Administered 2022-07-25: 100 meq via INTRAVENOUS
  Filled 2022-07-25: qty 50

## 2022-07-25 MED ORDER — KETOROLAC TROMETHAMINE 15 MG/ML IJ SOLN
15.0000 mg | Freq: Once | INTRAMUSCULAR | Status: AC
  Administered 2022-07-25: 15 mg via INTRAVENOUS
  Filled 2022-07-25: qty 1

## 2022-07-25 MED ORDER — NALOXONE HCL 2 MG/2ML IJ SOSY: 1.0000 mg | PREFILLED_SYRINGE | INTRAMUSCULAR | Status: DC | PRN

## 2022-07-25 MED ORDER — NALOXONE HCL 2 MG/2ML IJ SOSY
1.0000 mg | PREFILLED_SYRINGE | Freq: Once | INTRAMUSCULAR | Status: AC
  Administered 2022-07-25: 1 mg via INTRAVENOUS
  Filled 2022-07-25: qty 2

## 2022-07-25 MED ORDER — SODIUM CHLORIDE 0.9 % IV SOLN
3.0000 g | Freq: Once | INTRAVENOUS | Status: AC
  Administered 2022-07-25: 3 g via INTRAVENOUS
  Filled 2022-07-25: qty 8

## 2022-07-25 MED ORDER — SODIUM CHLORIDE 0.9 % IV BOLUS
1000.0000 mL | Freq: Once | INTRAVENOUS | Status: AC
  Administered 2022-07-25: 1000 mL via INTRAVENOUS

## 2022-07-25 MED ORDER — DEXTROSE 50 % IV SOLN
INTRAVENOUS | Status: AC
  Administered 2022-07-25: 50 mL
  Filled 2022-07-25: qty 50

## 2022-07-25 MED ORDER — SODIUM CHLORIDE 0.9 % IV SOLN
3.0000 g | Freq: Four times a day (QID) | INTRAVENOUS | Status: DC
  Administered 2022-07-26 (×2): 3 g via INTRAVENOUS
  Filled 2022-07-25 (×2): qty 8

## 2022-07-25 MED ORDER — LACTATED RINGERS IV BOLUS
1000.0000 mL | Freq: Once | INTRAVENOUS | Status: AC
  Administered 2022-07-25: 1000 mL via INTRAVENOUS

## 2022-07-25 MED ORDER — CHLORHEXIDINE GLUCONATE CLOTH 2 % EX PADS
6.0000 | MEDICATED_PAD | Freq: Every day | CUTANEOUS | Status: DC
  Administered 2022-07-25: 6 via TOPICAL

## 2022-07-25 MED ORDER — ONDANSETRON HCL 4 MG/2ML IJ SOLN
4.0000 mg | Freq: Four times a day (QID) | INTRAMUSCULAR | Status: DC | PRN
  Administered 2022-07-26 (×2): 4 mg via INTRAVENOUS
  Filled 2022-07-25 (×2): qty 2

## 2022-07-25 MED ORDER — ONDANSETRON HCL 4 MG/2ML IJ SOLN
4.0000 mg | Freq: Once | INTRAMUSCULAR | Status: AC
  Administered 2022-07-25: 4 mg via INTRAVENOUS
  Filled 2022-07-25: qty 2

## 2022-07-25 MED ORDER — ORAL CARE MOUTH RINSE: 15.0000 mL | OROMUCOSAL | Status: DC | PRN

## 2022-07-25 NOTE — H&P (Signed)
NAME:  Nicholas Vega, MRN:  [id number], DOB:  2001/03/19, LOS: 0 ADMISSION DATE:  07/25/2022, CONSULTATION DATE:  07/25/22 REFERRING MD:  Dr. Langston Masker, CHIEF COMPLAINT:  unresponsive   History of Present Illness:  21 year old male with prior hx of polysubstance abuse (meth, heroin, benzo, THC), prior SI, and currently homeless who was dropped off at ER unresponsive and pulseless.  CPR was started and continued briefly as was given intranasal narcan with immediate improvement.   Patient denied using drugs upon awakening, however his pregnant girlfriend in ER reported she was called by a friend who reported patient was "partying last night".    Workup thus far notable for temp 90.4, intermittently tachycardic, BP down trending with SBP 123- 83.  VBG 6.95/ 118, WBC 15.7 with left shift, and further labs pending.  CXR suspicious for RML infiltrate.  Cultures sent and empirically started on unasyn for aspiration, 2L NS bolus ordered, zofran, toradol, and given 2amps of bicarb.   EKG NSR, normal Qtc.  PCCM called for admit.   Patient is awake and currently denies taking any illicit drugs.  Denies daily ETOH use.  Not currently taking any prescribed medication.  States he doesn't know what happened.  Patient is vague historian, reporting he has felt unwell lately with sore throat and reports difficulty urinating lately but attributes that to his erectile dysfunction.    Pertinent  Medical History  Polysubstance abuse, prior SI, homelessness   Significant Hospital Events: Including procedures, antibiotic start and stop dates in addition to other pertinent events   10/19 admit s/p brief cardiac arrest 2/2 OD  Interim History / Subjective:   Objective   Blood pressure (!) 123/99, pulse 90, temperature (!) 90.4 F (32.4 C), temperature source Rectal, resp. rate 15, SpO2 99 %.       No intake or output data in the 24 hours ending 07/25/22 1741 There were no vitals filed for this  visit.  Examination: General:  Thin unkept young male lying in bed in NAD HEENT: MM pink/moist, pupils 3/ reactive Neuro: awake, oriented, appropriate, MAE CV: rr, no murmur  PULM:  non labored, clear left, RML and RLL rales and rhonchi, dried bilious vomit on bed linens GI: soft, bs+, NT Extremities: warm/dry, no LE edema  Skin: no rashes or obvious track marks, old scars to chest, several tattoos   Resolved Hospital Problem list    Assessment & Plan:   Brief cardiac arrest secondary to suspected accidental overdose - responded to narcan Hx of polysubstance abuse P:  - admit to ICU observation.  Patient is neurologically intact/ at baseline  - Goal MAP > 65, SBP currently in the 70's and receiving warm fluids> 2L ordered and will give additional 1L LR - lactic pending/ trend  - check CK - pending CMET and ammonia  - rewarming measures> likely to worsen hypotension - recheck BMET at 2200 - may need repeated narcan vs drip given whatever opiate he took may have longer half life than narcan - UDS and opiate quantitative pending, tylenol, salicylate, and ETOH pending - considered accidental vs intentional OD given prior ER visits for SI with plans for overdose however patient currently denies any harm or taking any drugs/ meds.   - follow cultures, UA pending  - continue unasyn as below - TOC consult for substance abuse and homelessness    Aspiration Pneumonia - supplemental O2 prn for sat goal > 92% - continue unasyn for now - NPO for now, if  remains stable overnight consider clear liquids and advancing as tolerated  - intermittent CXR   Severe metabolic acidosis Lactic acidosis  - hemodynamic support as above - pending BMET, UA   Best Practice (right click and "Reselect all SmartList Selections" daily)   Diet/type: NPO DVT prophylaxis: prophylactic heparin  GI prophylaxis: N/A Lines: N/A Foley:  N/A Code Status:  full code Last date of multidisciplinary goals  of care discussion [07/25/22]  Labs   CBC: Recent Labs  Lab 07/25/22 1634  WBC 15.7*  NEUTROABS 13.2*  HGB 16.7  HCT 50.9  MCV 91.5  PLT 344    Basic Metabolic Panel: No results for input(s): "NA", "K", "CL", "CO2", "GLUCOSE", "BUN", "CREATININE", "CALCIUM", "MG", "PHOS" in the last 168 hours. GFR: CrCl cannot be calculated (Patient's most recent lab result is older than the maximum 21 days allowed.). Recent Labs  Lab 07/25/22 1634  WBC 15.7*    Liver Function Tests: No results for input(s): "AST", "ALT", "ALKPHOS", "BILITOT", "PROT", "ALBUMIN" in the last 168 hours. No results for input(s): "LIPASE", "AMYLASE" in the last 168 hours. No results for input(s): "AMMONIA" in the last 168 hours.  ABG    Component Value Date/Time   HCO3 25.9 07/25/2022 1635   ACIDBASEDEF 9.0 (H) 07/25/2022 1635   O2SAT 29 07/25/2022 1635     Coagulation Profile: No results for input(s): "INR", "PROTIME" in the last 168 hours.  Cardiac Enzymes: No results for input(s): "CKTOTAL", "CKMB", "CKMBINDEX", "TROPONINI" in the last 168 hours.  HbA1C: Hgb A1c MFr Bld  Date/Time Value Ref Range Status  07/20/2021 06:28 AM 4.5 (L) 4.8 - 5.6 % Final    Comment:    (NOTE) Pre diabetes:          5.7%-6.4%  Diabetes:              >6.4%  Glycemic control for   <7.0% adults with diabetes     CBG: Recent Labs  Lab 07/25/22 1635  GLUCAP 231*    Review of Systems:   Review of Systems  Respiratory:  Negative for cough and shortness of breath.   Cardiovascular:  Negative for chest pain.  Gastrointestinal:  Negative for abdominal pain and nausea.  Neurological:  Negative for focal weakness and weakness.  Psychiatric/Behavioral:  Negative for substance abuse and suicidal ideas.    Past Medical History:  He,  has a past medical history of Adjustment disorder.   Surgical History:   Past Surgical History:  Procedure Laterality Date   ANKLE CLOSED REDUCTION Right 01/16/2016   Procedure:  CLOSED REDUCTION ANKLE;  Surgeon: Sheral Apley, MD;  Location: [facility] OR;  Service: Orthopedics;  Laterality: Right;   CIRCUMCISION     PERCUTANEOUS PINNING Right 01/16/2016   Procedure: PERCUTANEOUS PINNING EXTREMITY;  Surgeon: Sheral Apley, MD;  Location: [facility] OR;  Service: Orthopedics;  Laterality: Right;     Social History:   reports that he has never smoked. He has never used smokeless tobacco. He reports current drug use. Drugs: Methamphetamines, Amphetamines, Cocaine, and IV.   Family History:  His family history includes Cancer in his maternal grandmother and sister; Diabetes in his maternal aunt; Hypertension in his maternal grandfather and paternal grandfather. There is no history of Alcohol abuse, Arthritis, Asthma, Birth defects, COPD, Depression, Drug abuse, Early death, Hearing loss, Heart disease, Hyperlipidemia, Kidney disease, Learning disabilities, Mental illness, Mental retardation, Miscarriages / Stillbirths, Stroke, Vision loss, or Varicose Veins.   Allergies Allergies  Allergen Reactions   Red  Dye Other (See Comments)    Exact allergic reaction not known   Strawberry Extract Other (See Comments)    Exact allergic reaction not known     Home Medications  Prior to Admission medications   Medication Sig Start Date End Date Taking? Authorizing Provider  divalproex (DEPAKOTE ER) 500 MG 24 hr tablet Take 2 tablets (1,000 mg total) by mouth at bedtime. Patient not taking: Reported on 07/25/2022 07/30/21   Roselle Locus, MD  nicotine (NICODERM CQ - DOSED IN MG/24 HOURS) 21 mg/24hr patch Place 1 patch (21 mg total) onto the skin daily. Patient not taking: Reported on 03/18/2022 07/30/21   Roselle Locus, MD  nicotine polacrilex (NICORETTE) 2 MG gum Take 1 each (2 mg total) by mouth as needed for smoking cessation. Patient not taking: Reported on 03/18/2022 07/30/21   Roselle Locus, MD  OLANZapine (ZYPREXA) 10 MG tablet Take 10 mg by mouth at  bedtime. Patient not taking: Reported on 07/16/2021 07/09/21   [provider]  thiamine 100 MG tablet Take 1 tablet (100 mg total) by mouth daily. Patient not taking: Reported on 07/25/2022 07/31/21   Roselle Locus, MD  traZODone (DESYREL) 50 MG tablet Take 1 tablet (50 mg total) by mouth at bedtime as needed for sleep. Patient not taking: Reported on 03/18/2022 07/30/21   Roselle Locus, MD     Critical care time: 45 mins     Posey Boyer, MSN, AG-ACNP-BC [facility] 07/25/2022, 6:29 PM  See Amion for pager If no response to pager, please call PCCM consult pager After 7:00 pm call Elink

## 2022-07-25 NOTE — ED Notes (Signed)
Critical VBG reported to trifin, MD. New orders in.

## 2022-07-25 NOTE — Progress Notes (Signed)
eLink Physician-Brief Progress Note Patient Name: Nicholas Vega DOB: [birth year] MRN: [id number]   Date of Service  07/25/2022  HPI/Events of Note  21 yr old male with history of substance abuse brought in unresponsive with PEA arrest.  Responded to narcan.  Did not require intubation.  Admission labs reviewed.  Resp acidosis on admission much improved.  Mild elevation of ammonia.    eICU Interventions  Chart reviewed     Intervention Category Evaluation Type: New Patient Evaluation  Mauri Brooklyn, P 07/25/2022, 8:38 PM

## 2022-07-25 NOTE — ED Provider Notes (Signed)
[facility]-EMERGENCY DEPT Provider Note   CSN: [id number] Arrival date & time: 07/25/22  1625     History  Chief Complaint  Patient presents with   Drug Overdose    Nicholas Vega is a 21 y.o. male with history of polysubstance use presenting to ED with concern for possible overdose.  Supplemental history is provided by the patient's girlfriend who arrives later at the bedside, reports that she was not with the patient, but was called by the patient's friend, who was dropping him off at the hospital.  There was report that the patient was "partying last night".  She is not aware whether he was using any opiates.  However the patient was unresponsive in the car outside the emergency department.  CPR was briefly began as he did not have palpable pulses.  He was given intranasal Narcan and immediately had improved response, opening his eyes and moaning.  Patient cannot provide any further history.  *Update -patient is now awake enough to report that he was sleeping outside.  He is homeless.  He denies several times to me that he used any drugs.  However his girlfriend outside the room as well as a car for his mother reports the patient is a frequent user of drugs, and they think he used methamphetamines last night, and they are concerned it may have been laced with heroin or another opiate.  HPI     Home Medications Prior to Admission medications   Medication Sig Start Date End Date Taking? Authorizing Provider  divalproex (DEPAKOTE ER) 500 MG 24 hr tablet Take 2 tablets (1,000 mg total) by mouth at bedtime. Patient not taking: Reported on 07/25/2022 07/30/21   Roselle Locus, MD  nicotine (NICODERM CQ - DOSED IN MG/24 HOURS) 21 mg/24hr patch Place 1 patch (21 mg total) onto the skin daily. Patient not taking: Reported on 03/18/2022 07/30/21   Roselle Locus, MD  nicotine polacrilex (NICORETTE) 2 MG gum Take 1 each (2 mg total) by mouth as needed for  smoking cessation. Patient not taking: Reported on 03/18/2022 07/30/21   Roselle Locus, MD  OLANZapine (ZYPREXA) 10 MG tablet Take 10 mg by mouth at bedtime. Patient not taking: Reported on 07/16/2021 07/09/21   [provider]  thiamine 100 MG tablet Take 1 tablet (100 mg total) by mouth daily. Patient not taking: Reported on 07/25/2022 07/31/21   Roselle Locus, MD  traZODone (DESYREL) 50 MG tablet Take 1 tablet (50 mg total) by mouth at bedtime as needed for sleep. Patient not taking: Reported on 03/18/2022 07/30/21   Roselle Locus, MD      Allergies    Red dye and Strawberry extract    Review of Systems   Review of Systems  Physical Exam Updated Vital Signs BP (!) 123/57 (BP Location: Left Arm)   Pulse (!) 101   Temp 98.3 F (36.8 C) (Oral)   Resp 15   Ht 5\' 7"  (1.702 m)   Wt 60.9 kg   SpO2 95%   BMI 21.03 kg/m  Physical Exam Constitutional:      Comments: Moaning, opens eyes Shivering, shaking  HENT:     Head: Normocephalic and atraumatic.  Eyes:     Comments: Pinpoint pupils  Cardiovascular:     Rate and Rhythm: Normal rate and regular rhythm.  Pulmonary:     Effort: Pulmonary effort is normal. No respiratory distress.     Comments: Coarse breath sounds bilaterally, maintaining 98%  saturation on nonrebreather mask Abdominal:     General: There is no distension.     Tenderness: There is no abdominal tenderness.  Skin:    General: Skin is warm and dry.     Comments: Tattoos on skin  Neurological:     Comments: Pupils are equal.  Patient is moving all extremities and following voice command. GCS eyes 4, motor 6, verbal 3 = 13     ED Results / Procedures / Treatments   Labs (all labs ordered are listed, but only abnormal results are displayed) Labs Reviewed  BASIC METABOLIC PANEL - Abnormal; Notable for the following components:      Result Value   Glucose, Bld 240 (*)    Creatinine, Ser 1.32 (*)    All other components within  normal limits  CBC WITH DIFFERENTIAL/PLATELET - Abnormal; Notable for the following components:   WBC 15.7 (*)    Neutro Abs 13.2 (*)    Abs Immature Granulocytes 0.26 (*)    All other components within normal limits  HEPATIC FUNCTION PANEL - Abnormal; Notable for the following components:   Total Protein 8.3 (*)    All other components within normal limits  AMMONIA - Abnormal; Notable for the following components:   Ammonia 95 (*)    All other components within normal limits  BLOOD GAS, VENOUS - Abnormal; Notable for the following components:   pH, Ven 6.95 (*)    pCO2, Ven 118 (*)    pO2, Ven <31 (*)    Acid-base deficit 9.0 (*)    All other components within normal limits  LACTIC ACID, PLASMA - Abnormal; Notable for the following components:   Lactic Acid, Venous 6.7 (*)    All other components within normal limits  ACETAMINOPHEN LEVEL - Abnormal; Notable for the following components:   Acetaminophen (Tylenol), Serum <10 (*)    All other components within normal limits  SALICYLATE LEVEL - Abnormal; Notable for the following components:   Salicylate Lvl <1.5 (*)    All other components within normal limits  BLOOD GAS, VENOUS - Abnormal; Notable for the following components:   pO2, Ven <31 (*)    Bicarbonate 29.8 (*)    Acid-Base Excess 2.9 (*)    All other components within normal limits  BASIC METABOLIC PANEL - Abnormal; Notable for the following components:   Glucose, Bld 46 (*)    Calcium 7.6 (*)    All other components within normal limits  CBG MONITORING, ED - Abnormal; Notable for the following components:   Glucose-Capillary 231 (*)    All other components within normal limits  CULTURE, BLOOD (ROUTINE X 2)  CULTURE, BLOOD (ROUTINE X 2)  URINE CULTURE  MRSA NEXT GEN BY PCR, NASAL  ETHANOL  LACTIC ACID, PLASMA  CK  RAPID URINE DRUG SCREEN, HOSP PERFORMED  BASIC METABOLIC PANEL  MAGNESIUM  PHOSPHORUS  URINALYSIS, ROUTINE W REFLEX MICROSCOPIC  OPIATE,  QUANTITATIVE, URINE    EKG EKG Interpretation  Date/Time:  Thursday July 25 2022 16:31:09 EDT Ventricular Rate:  83 PR Interval:    QRS Duration: 116 QT Interval:  424 QTC Calculation: 499 R Axis:   90 Text Interpretation: Sinus rhythm Artifact in lead(s) III V5 V6 Confirmed by Octaviano Glow [phone]) on 07/25/2022 4:48:35 PM  Radiology CT Head Wo Contrast  Result Date: 07/25/2022 CLINICAL DATA:  Mental status change.  CPR EXAM: CT HEAD WITHOUT CONTRAST TECHNIQUE: Contiguous axial images were obtained from the base of the skull through the  vertex without intravenous contrast. RADIATION DOSE REDUCTION: This exam was performed according to the departmental dose-optimization program which includes automated exposure control, adjustment of the mA and/or kV according to patient size and/or use of iterative reconstruction technique. COMPARISON:  None Available. FINDINGS: Brain: No evidence of acute infarction, hemorrhage, hydrocephalus, extra-axial collection or mass lesion/mass effect. Vascular: Negative for hyperdense vessel Skull: Negative Sinuses/Orbits: Negative Other: None IMPRESSION: Negative CT head. Electronically Signed   By: Marlan Palau M.D.   On: 07/25/2022 18:58   DG Chest Portable 1 View  Result Date: 07/25/2022 CLINICAL DATA:  suspected opioid overdose, hypoxia, evaluation for aspiration EXAM: PORTABLE CHEST 1 VIEW COMPARISON:  Two-view chest radiograph 05/31/2022 FINDINGS: Unchanged cardiomediastinal silhouette. There is a right midlung opacity. No pleural effusion. No evidence of pneumothorax. No acute osseous abnormality. IMPRESSION: Right midlung opacity concerning for pneumonia, potentially aspiration given the provided history. Electronically Signed   By: Caprice Renshaw M.D.   On: 07/25/2022 16:58    Procedures .Critical Care  Performed by: Terald Sleeper, MD Authorized by: Terald Sleeper, MD   Critical care provider statement:    Critical care time (minutes):   45   Critical care time was exclusive of:  Separately billable procedures and treating other patients   Critical care was necessary to treat or prevent imminent or life-threatening deterioration of the following conditions:  Respiratory failure and sepsis   Critical care was time spent personally by me on the following activities:  Ordering and performing treatments and interventions, ordering and review of laboratory studies, ordering and review of radiographic studies, pulse oximetry, review of old charts, examination of patient and evaluation of patient's response to treatment   Care discussed with: admitting provider       Medications Ordered in ED Medications  Ampicillin-Sulbactam (UNASYN) 3 g in sodium chloride 0.9 % 100 mL IVPB (has no administration in time range)  ondansetron (ZOFRAN) injection 4 mg (has no administration in time range)  naloxone [facility]) injection 1 mg (has no administration in time range)  Chlorhexidine Gluconate Cloth 2 % PADS 6 each (6 each Topical Given 07/25/22 2000)  Oral care mouth rinse (has no administration in time range)  naloxone [facility]) injection 1 mg (1 mg Intravenous Given 07/25/22 1649)  sodium chloride 0.9 % bolus 1,000 mL (0 mLs Intravenous Stopped 07/25/22 1912)  Ampicillin-Sulbactam (UNASYN) 3 g in sodium chloride 0.9 % 100 mL IVPB (0 g Intravenous Stopped 07/25/22 1912)  sodium chloride 0.9 % bolus 1,000 mL (0 mLs Intravenous Stopped 07/25/22 1912)  sodium bicarbonate injection 100 mEq (100 mEq Intravenous Given 07/25/22 1741)  ondansetron (ZOFRAN) injection 4 mg (4 mg Intravenous Given 07/25/22 1750)  ketorolac (TORADOL) 15 MG/ML injection 15 mg (15 mg Intravenous Given 07/25/22 1750)  lactated ringers bolus 1,000 mL (0 mLs Intravenous Stopped 07/25/22 2009)    ED Course/ Medical Decision Making/ A&P Clinical Course as of 07/25/22 2111  Thu Jul 25, 2022  1640 Glucose-Capillary(!): 231 [MT]  1718 pH, Ven(!!): 6.95 [MT]  1718 Acidosis  likely related to respiratory depression given the patient was apneic on arrival.  We will give 2 A of bicarb.  He is now wide-awake and breathing well on his own with improved mentation.  We will need repeat pH check at 6 pm [MT]  1739 I spoke to the ICU who will evaluate patient for admission. [MT]  1739 Repeat venous gas ordered now.  Patient's CO2 retention was likely related to his prolonged apnea prior to arrival,  we will ensure that it is trending in a better direction, now that he is awake and breathing deeply and normally on his own.  Given his mental status is improved so significantly I do not believe he needs BiPAP or intubation at the moment.  We will continue to monitor closely [MT]    Clinical Course User Index [MT] Othella Slappey, Kermit Balo, MD                           Medical Decision Making Amount and/or Complexity of Data Reviewed Labs: ordered. Decision-making details documented in ED Course. Radiology: ordered. ECG/medicine tests: ordered.  Risk Prescription drug management. Decision regarding hospitalization.   This patient presents to the ED with concern for AMS. This involves an extensive number of treatment options, and is a complaint that carries with it a high risk of complications and morbidity.  The differential diagnosis includes polypharmacy vs opioid overdose vs hypoglycemia vs other  This presentation is highly consistent with opioid overdose in the setting of hyperthermic environmental exposure, spending the night outdoors.  He likely aspirated as well.  He has developed an acidosis from his prolonged apnea.  Co-morbidities that complicate the patient evaluation: hx of polysubstance use  Additional history obtained from girlfriend at bedside  External records from outside source obtained and reviewed including polysubstance use visit, PDMP with no recent controlled prescriptions  I ordered and personally interpreted labs.  The pertinent results include:  Leukocytosis, severe respiratory hypercapnic acidosis, glucose 200s.  I ordered imaging studies including CT head, x-ray of the chest I independently visualized and interpreted imaging which showed right middle lobe infiltrate on x-ray, likely aspiration.  CT head with no acute findings I agree with the radiologist interpretation  The patient was maintained on a cardiac monitor.  I personally viewed and interpreted the cardiac monitored which showed an underlying rhythm of: Sinus tachycardia  Per my interpretation the patient's ECG shows sinus tachycardia  I ordered medication including IV fluid bolus and Unasyn for aspiration with sepsis criteria.  Zofran for nausea.  Sodium bicarb for severe acidosis.  Toradol for body pain.  Narcan for p.o. overdose  I have reviewed the patients home medicines and have made adjustments as needed  Test Considered: Lower suspicion for acute PE in this clinical setting  I requested consultation with the critical care,  and discussed lab and imaging findings as well as pertinent plan - they recommend: ICU admission  After the interventions noted above, I reevaluated the patient and found that they have: improved   Dispostion:  After consideration of the diagnostic results and the patients response to treatment, I feel that the patent would benefit from medical admission.         Final Clinical Impression(s) / ED Diagnoses Final diagnoses:  Opioid overdose, accidental or unintentional, initial encounter (HCC)  Pneumonia of right middle lobe due to infectious organism  Respiratory acidosis    Rx / DC Orders ED Discharge Orders     None         Terald Sleeper, MD 07/25/22 2111

## 2022-07-25 NOTE — ED Triage Notes (Signed)
Pt arrives by car unresponsive and no pulse. Pt transferred from car to stretcher and CPR started in triage. 0.4mg  Narcan administered with eye opening and pulse confirmed.

## 2022-07-25 NOTE — Progress Notes (Signed)
Chaplain encountered Nicholas Vega's support people outside of the room in the ED and spoke with the mother of his baby (she is 7 mos pregnant) and her mother.  She received a phone call and was able to move quickly to get him help.  Chaplain offered additional support including water or anything she needed for her own care, but she declined at this time.  They are eager to get to see him.  [address] [place] Pager, [phone]

## 2022-07-25 NOTE — ED Notes (Addendum)
Pt on bair hugger at highest temperature. Pt given fluids through fluid warmer.

## 2022-07-25 NOTE — ED Notes (Addendum)
Pt arrives w/ no pulse @ 1628. CPR initiated as pt was brought back to [facility] room. Multiple rounds of compressions performed by DJ, EMT and Horris Latino, EMT. Pt was bagged by Sharrie Rothman, RN. 0.4mg  narcan by Caryl Pina, RN was administered & pt became alert w/ pulse. PA John in [facility] room. IV started by Jarrett Soho, RN. Pt A&O at this time.

## 2022-07-26 DIAGNOSIS — I469 Cardiac arrest, cause unspecified: Secondary | ICD-10-CM | POA: Diagnosis not present

## 2022-07-26 DIAGNOSIS — J690 Pneumonitis due to inhalation of food and vomit: Secondary | ICD-10-CM | POA: Diagnosis not present

## 2022-07-26 DIAGNOSIS — T50901A Poisoning by unspecified drugs, medicaments and biological substances, accidental (unintentional), initial encounter: Secondary | ICD-10-CM

## 2022-07-26 LAB — PHOSPHORUS: Phosphorus: 3.5 mg/dL (ref 2.5–4.6)

## 2022-07-26 LAB — BASIC METABOLIC PANEL
Anion gap: 10 (ref 5–15)
BUN: 8 mg/dL (ref 6–20)
CO2: 25 mmol/L (ref 22–32)
Calcium: 8.3 mg/dL — ABNORMAL LOW (ref 8.9–10.3)
Chloride: 101 mmol/L (ref 98–111)
Creatinine, Ser: 0.74 mg/dL (ref 0.61–1.24)
GFR, Estimated: >60 mL/min (ref >60)
Glucose, Bld: 81 mg/dL (ref 70–99)
Potassium: 4 mmol/L (ref 3.5–5.1)
Sodium: 136 mmol/L (ref 135–145)

## 2022-07-26 LAB — URINALYSIS, ROUTINE W REFLEX MICROSCOPIC
Bilirubin Urine: NEGATIVE
Glucose, UA: >=500 mg/dL — AB
Hgb urine dipstick: NEGATIVE
Ketones, ur: 20 mg/dL — AB
Leukocytes,Ua: NEGATIVE
Nitrite: NEGATIVE
Protein, ur: 30 mg/dL — AB
Renal Epithelial: 4
Specific Gravity, Urine: 1.015 (ref 1.005–1.030)
pH: 6 (ref 5.0–8.0)

## 2022-07-26 LAB — RAPID URINE DRUG SCREEN, HOSP PERFORMED
Amphetamines: POSITIVE — AB
Barbiturates: NOT DETECTED
Benzodiazepines: NOT DETECTED
Cocaine: NOT DETECTED
Opiates: NOT DETECTED
Tetrahydrocannabinol: POSITIVE — AB

## 2022-07-26 LAB — GLUCOSE, CAPILLARY: Glucose-Capillary: 130 mg/dL — ABNORMAL HIGH (ref 70–99)

## 2022-07-26 LAB — MAGNESIUM: Magnesium: 1.5 mg/dL — ABNORMAL LOW (ref 1.7–2.4)

## 2022-07-26 LAB — AMMONIA: Ammonia: 43 umol/L — ABNORMAL HIGH (ref 9–35)

## 2022-07-26 MED ORDER — CHLORHEXIDINE GLUCONATE CLOTH 2 % EX PADS: 6.0000 | MEDICATED_PAD | Freq: Every day | CUTANEOUS | Status: DC

## 2022-07-26 MED ORDER — AMOXICILLIN-POT CLAVULANATE 875-125 MG PO TABS: 1.0000 | ORAL_TABLET | Freq: Two times a day (BID) | ORAL | 0 refills | Status: AC

## 2022-07-26 MED ORDER — AMOXICILLIN-POT CLAVULANATE 875-125 MG PO TABS
1.0000 | ORAL_TABLET | Freq: Two times a day (BID) | ORAL | Status: DC
  Administered 2022-07-26: 1 via ORAL
  Filled 2022-07-26: qty 1

## 2022-07-26 MED ORDER — MAGNESIUM SULFATE 4 GM/100ML IV SOLN
4.0000 g | Freq: Once | INTRAVENOUS | Status: AC
  Administered 2022-07-26: 4 g via INTRAVENOUS
  Filled 2022-07-26: qty 100

## 2022-07-26 NOTE — Discharge Summary (Addendum)
Discharge Summary  Name: Nicholas Vega MRN: [id number] DOB: [birth year] Admit date: 07/25/2022 Discharge date: 07/26/2022  Final diagnoses: Acute toxic encephalopathy 2nd to accidental overdose Aspiration pneumonitis Nausea with vomiting Hypomagnesemia Polysubstance abuse PEA cardiac arrest from accidental overdose  History: 21 year old male with prior hx of polysubstance abuse (meth, heroin, benzo, THC), prior SI, and currently homeless who was dropped off at ER unresponsive and pulseless.  CPR was started and continued briefly as was given intranasal narcan with immediate improvement.   Patient denied using drugs upon awakening, however his pregnant girlfriend in ER reported she was called by a friend who reported patient was "partying last night".     Workup thus far notable for temp 90.4, intermittently tachycardic, BP down trending with SBP 123- 83.  VBG 6.95/ 118, WBC 15.7 with left shift, and further labs pending.  CXR suspicious for RML infiltrate.  Cultures sent and empirically started on unasyn for aspiration, 2L NS bolus ordered, zofran, toradol, and given 2amps of bicarb.   EKG NSR, normal Qtc.  PCCM called for admit.    Patient awake in ER.  Denied any illicit drug use.  Denies daily ETOH use.  Not currently taking any prescribed medication.  States he doesn't remember what happened.    He felt better on 07/26/22.  He was seen by transition of care team and provider information for outpatient resources.  Vital signs: BP 127/69   Pulse 87   Temp 99.3 F (37.4 C) (Oral)   Resp (!) 22   Ht 5\' 7"  (1.702 m)   Wt 61 kg   SpO2 98%   BMI 21.06 kg/m   Physical exam: General - alert Eyes - pupils reactive ENT - no sinus tenderness, no stridor Cardiac - regular rate/rhythm, no murmur Chest - equal breath sounds b/l, no wheezing or rales Abdomen - soft, non tender, + bowel sounds Extremities - no cyanosis, clubbing, or edema Skin - no rashes Neuro - normal strength, moves  extremities, follows commands Psych - normal mood and behavior  Labs and imaging:     Latest Ref Rng & Units 07/26/2022    2:33 AM 07/25/2022    8:14 PM 07/25/2022    4:34 PM  CMP  Glucose 70 - 99 mg/dL 81  46  240   BUN 6 - 20 mg/dL 8  12  13    Creatinine 0.61 - 1.24 mg/dL 0.74  0.84  1.32   Sodium 135 - 145 mmol/L 136  140  139   Potassium 3.5 - 5.1 mmol/L 4.0  4.2  4.8   Chloride 98 - 111 mmol/L 101  108  102   CO2 22 - 32 mmol/L 25  24  27    Calcium 8.9 - 10.3 mg/dL 8.3  7.6  8.9   Total Protein 6.5 - 8.1 g/dL   8.3   Total Bilirubin 0.3 - 1.2 mg/dL   0.5   Alkaline Phos 38 - 126 U/L   84   AST 15 - 41 U/L   24   ALT 0 - 44 U/L   15        Latest Ref Rng & Units 07/25/2022    4:34 PM 05/31/2022    4:20 PM 03/18/2022    5:04 PM  CBC  WBC 4.0 - 10.5 K/uL 15.7  16.5  7.8   Hemoglobin 13.0 - 17.0 g/dL 16.7  15.6  14.1   Hematocrit 39.0 - 52.0 % 50.9  45.4  41.8  Platelets 150 - 400 K/uL 344  256  301    Drugs of Abuse     Component Value Date/Time   LABOPIA NONE DETECTED 07/26/2022 0109   COCAINSCRNUR NONE DETECTED 07/26/2022 0109   LABBENZ NONE DETECTED 07/26/2022 0109   AMPHETMU POSITIVE (A) 07/26/2022 0109   THCU POSITIVE (A) 07/26/2022 0109   LABBARB NONE DETECTED 07/26/2022 0109     CT Head Wo Contrast  Result Date: 07/25/2022 CLINICAL DATA:  Mental status change.  CPR EXAM: CT HEAD WITHOUT CONTRAST TECHNIQUE: Contiguous axial images were obtained from the base of the skull through the vertex without intravenous contrast. RADIATION DOSE REDUCTION: This exam was performed according to the departmental dose-optimization program which includes automated exposure control, adjustment of the mA and/or kV according to patient size and/or use of iterative reconstruction technique. COMPARISON:  None Available. FINDINGS: Brain: No evidence of acute infarction, hemorrhage, hydrocephalus, extra-axial collection or mass lesion/mass effect. Vascular: Negative for hyperdense  vessel Skull: Negative Sinuses/Orbits: Negative Other: None IMPRESSION: Negative CT head. Electronically Signed   By: Marlan Palau M.D.   On: 07/25/2022 18:58   DG Chest Portable 1 View  Result Date: 07/25/2022 CLINICAL DATA:  suspected opioid overdose, hypoxia, evaluation for aspiration EXAM: PORTABLE CHEST 1 VIEW COMPARISON:  Two-view chest radiograph 05/31/2022 FINDINGS: Unchanged cardiomediastinal silhouette. There is a right midlung opacity. No pleural effusion. No evidence of pneumothorax. No acute osseous abnormality. IMPRESSION: Right midlung opacity concerning for pneumonia, potentially aspiration given the provided history. Electronically Signed   By: Caprice Renshaw M.D.   On: 07/25/2022 16:58    Discharge medications: Allergies as of 07/26/2022       Reactions   Red Dye Other (See Comments)   Exact allergic reaction not known   Strawberry Extract Other (See Comments)   Exact allergic reaction not known        Medication List     STOP taking these medications    divalproex 500 MG 24 hr tablet Commonly known as: DEPAKOTE ER   nicotine 21 mg/24hr patch Commonly known as: NICODERM CQ - dosed in mg/24 hours   nicotine polacrilex 2 MG gum Commonly known as: NICORETTE   OLANZapine 10 MG tablet Commonly known as: ZYPREXA   thiamine 100 MG tablet Commonly known as: VITAMIN B1   traZODone 50 MG tablet Commonly known as: DESYREL       TAKE these medications    amoxicillin-clavulanate 875-125 MG tablet Commonly known as: AUGMENTIN Take 1 tablet by mouth every 12 (twelve) hours. Start taking on: July 27, 2022        Disposition: Discharge home  Activity: As tolerated  Diet:  Regular diet  Follow up care: He reports he will arrange for outpatient follow up on his own.  Spent 25 minutes in discharge planning.  Coralyn Helling, MD [facility] Pulmonary/Critical Care Pager - [phone], or [phone] 07/26/2022, 6:39 PM

## 2022-07-26 NOTE — H&P (Signed)
NAME:  Nicholas Vega, MRN:  [id number], DOB:  Aug 06, 2001, LOS: 0 ADMISSION DATE:  07/25/2022, CONSULTATION DATE:  07/25/22 REFERRING MD:  Dr. Langston Masker, CHIEF COMPLAINT:  unresponsive   History of Present Illness:  21 year old male with prior hx of polysubstance abuse (meth, heroin, benzo, THC), prior SI, and currently homeless who was dropped off at ER unresponsive and pulseless.  CPR was started and continued briefly as was given intranasal narcan with immediate improvement.   Patient denied using drugs upon awakening, however his pregnant girlfriend in ER reported she was called by a friend who reported patient was "partying last night".    Workup thus far notable for temp 90.4, intermittently tachycardic, BP down trending with SBP 123- 83.  VBG 6.95/ 118, WBC 15.7 with left shift, and further labs pending.  CXR suspicious for RML infiltrate.  Cultures sent and empirically started on unasyn for aspiration, 2L NS bolus ordered, zofran, toradol, and given 2amps of bicarb.   EKG NSR, normal Qtc.  PCCM called for admit.   Patient awake in ER.  Denied any illicit drug use.  Denies daily ETOH use.  Not currently taking any prescribed medication.  States he doesn't remember what happened.    Pertinent  Medical History  Polysubstance abuse, prior SI, homelessness   Significant Hospital Events: Including procedures, antibiotic start and stop dates in addition to other pertinent events   10/19 admit s/p brief cardiac arrest 2/2 accidental OD  Interim History / Subjective:  Episode of vomiting overnight after drinking water too fast per patient but able to tolerate food since.  C/o of some soreness otherwise no complaints.  Ambulated around unit on room air without issues.   Objective   Blood pressure (!) 142/76, pulse 67, temperature 98.3 F (36.8 C), temperature source Oral, resp. rate 12, height 5\' 7"  (1.702 m), weight 61 kg, SpO2 99 %.        Intake/Output Summary (Last 24 hours) at 07/26/2022  0817 Last data filed at 07/26/2022 0730 Gross per 24 hour  Intake 1200 ml  Output 1550 ml  Net -350 ml   Filed Weights   07/25/22 1954 07/26/22 0440  Weight: 60.9 kg 61 kg    Examination: General:  Thin young adult male lying in bed in NAD HEENT: MM pink/moist, pupils 3/reactive, anicteric  Neuro:  Awake, oriented, appropriate, MAE CV: rr, NSR, no murmur PULM:  non labored, on room air, few crackles and diminished in right base otherwise clear GI: soft, bs+, ND/ NT, voiding well  Extremities: warm/dry, no LE edema  Skin: no rashes   Labs reviewed > BMET unremarkable, Mag 1.5, ammonia 95> 43, lactate 6.7> 1.8, LFTs unremarkable on admit, UDS +amphetamines/ THC, UA neg, neg ETOH, CK 102  Resolved Hospital Problem list    Assessment & Plan:   Brief cardiac arrest secondary to suspected to presumed accidental overdose - responded immediately to narcan Hypotension vs post arrest vasoplegia> resolved after 3L fluids  Hx of polysubstance abuse Mixed respiratory and metabolic acidosis, resolved  AKI, resolved  Hypothermia, resolved  P:  - remains hemodynamically stable overnight without neurological deficits - labs reassuring> no residual organ dysfunction, sCr 1.32> 0.74 - UDS positive for amphetamines and THC - pending urine opiate quantitative - good UOP and eating well - remains afebrile - pending blood cultures  - stable for transfer to med-surg.   - patient continues to deny SI/ or intentional harm.  Does not remember what happened or recall taking  any drugs. - ongoing substance abuse cessation counseling - tylenol prn pain - pending TOC consult for social (homelessness) and polysubstance abuse issues> once addressed can likely be discharged  Aspiration Pneumonia vs pneumonitis - right sided infiltrate  Acute hypercarbic and hypoxic respiratory failure, resolved  - not needing any supplemental O2  - repeat VBG with hypercarbia resolved  - stable respiratory status  overnight - change unasyn to augmentin for total of 5 days of abx   Hypomagnesemia - s/p replete.  K 4  Best Practice (right click and "Reselect all SmartList Selections" daily)   Diet/type: Regular consistency (see orders) DVT prophylaxis: prophylactic heparin  GI prophylaxis: N/A Lines: N/A Foley:  N/A Code Status:  full code Last date of multidisciplinary goals of care discussion [07/25/22]  Patient updated on plan of care 10/20.  Patient request that we do not discuss information in front of his family or GF.  Mother and sister (visiting from [place]) and his "baby mama"/ girlfriend stayed overnight with him.    Labs   CBC: Recent Labs  Lab 07/25/22 1634  WBC 15.7*  NEUTROABS 13.2*  HGB 16.7  HCT 50.9  MCV 91.5  PLT 344     Basic Metabolic Panel: Recent Labs  Lab 07/25/22 1634 07/25/22 2014 07/26/22 0233  NA 139 140 136  K 4.8 4.2 4.0  CL 102 108 101  CO2 27 24 25   GLUCOSE 240* 46* 81  BUN 13 12 8   CREATININE 1.32* 0.84 0.74  CALCIUM 8.9 7.6* 8.3*  MG  --   --  1.5*  PHOS  --   --  3.5   GFR: Estimated Creatinine Clearance: 128.1 mL/min (by C-G formula based on SCr of 0.74 mg/dL). Recent Labs  Lab 07/25/22 1634 07/25/22 1808 07/25/22 2006  WBC 15.7*  --   --   LATICACIDVEN  --  6.7* 1.8     Liver Function Tests: Recent Labs  Lab 07/25/22 1634  AST 24  ALT 15  ALKPHOS 84  BILITOT 0.5  PROT 8.3*  ALBUMIN 4.7   No results for input(s): "LIPASE", "AMYLASE" in the last 168 hours. Recent Labs  Lab 07/25/22 1635  AMMONIA 95*    ABG    Component Value Date/Time   HCO3 29.8 (H) 07/25/2022 1750   ACIDBASEDEF 9.0 (H) 07/25/2022 1635   O2SAT 25.2 07/25/2022 1750     Coagulation Profile: No results for input(s): "INR", "PROTIME" in the last 168 hours.  Cardiac Enzymes: Recent Labs  Lab 07/25/22 1634  CKTOTAL 102    HbA1C: Hgb A1c MFr Bld  Date/Time Value Ref Range Status  07/20/2021 06:28 AM 4.5 (L) 4.8 - 5.6 % Final     Comment:    (NOTE) Pre diabetes:          5.7%-6.4%  Diabetes:              >6.4%  Glycemic control for   <7.0% adults with diabetes     CBG: Recent Labs  Lab 07/25/22 1635 07/25/22 2230  GLUCAP 231* 63*       Critical care time: n/a     Kennieth Rad, MSN, AG-ACNP-BC [facility] 07/26/2022, 8:17 AM  See Amion for pager If no response to pager, please call PCCM consult pager After 7:00 pm call Elink

## 2022-07-26 NOTE — TOC Transition Note (Addendum)
Transition of Care [facility]) - CM/SW Discharge Note   Patient Details  Name: Nicholas Vega MRN: [id number] Date of Birth: [birth year]  Transition of Care [facility] CM/SW Contact:  Dessa Phi, RN Phone Number: 07/26/2022, 2:56 PM   Clinical Narrative: Spoke to patient about d/c plans.Patient states he has a place to go to @ d/c,has medicaid-has pcp, obligated co pay for meds.Declines any SA resource,states he can manage on own. Will provide shelter resources.No further CM needs.  -4:47p-Went into rm to explain resources provided-patient/mother/girlfriend pregnant in rm-they had concerns about housing-explained that the resources for housing is very limited-they have waiting lists-patient or mother can call to inquire about process.Referral to [facility]-mother said she is aware of that & they all have wait lists.Mother understands & appreciates anything that I can do. No further CM needs.    Final next level of care: Home/Self Care Barriers to Discharge: No Barriers Identified   Patient Goals and CMS Choice Patient states their goals for this hospitalization and ongoing recovery are::  (Home) CMS Medicare.gov Compare Post Acute Care list provided to:: Patient Choice offered to / list presented to : Patient  Discharge Placement                       Discharge Plan and Services   Discharge Planning Services: CM Consult                                 Social Determinants of Health (SDOH) Interventions Housing Interventions: Inpatient TOC   Readmission Risk Interventions     No data to display

## 2022-07-26 NOTE — Progress Notes (Signed)
Patient and family notified of overnight visitor policy. Mother of patient continued to verbalize frustration that they were not notified of policy in a timely manner. Apologized to patient and family for misunderstanding, but reinforced overnight visitor policy. Charge RN notified, and spoke with pt. Patient was wondering if he could discharge this evening instead of in the morning, since he is medically cleared and stable. Notified MD. Discharge orders placed. Communicated this back to patient, and patient in agreement. Clothes provided to patient as well. Patient verbalized and understood discharge paperwork.

## 2022-07-26 NOTE — Progress Notes (Signed)
[facility] ADULT ICU REPLACEMENT PROTOCOL   The patient does apply for the [facility] Adult ICU Electrolyte Replacment Protocol based on the criteria listed below:   1.Exclusion criteria: TCTS patients, ECMO patients, and Dialysis patients 2. Is GFR >/= 30 ml/min? Yes.    Patient's GFR today is >60 3. Is SCr </= 2? Yes.   Patient's SCr is 0.74 mg/dL 4. Did SCr increase >/= 0.5 in 24 hours? No. 5.Pt's weight >40kg  Yes.   6. Abnormal electrolyte(s):   Mg 1.5  7. Electrolytes replaced per protocol 8.  Call MD STAT for K+ </= 2.5, Phos </= 1, or Mag </= 1 Physician:  Bernadene Person R Martavious Hartel 07/26/2022 6:32 AM

## 2022-07-27 LAB — URINE CULTURE: Culture: NO GROWTH

## 2022-07-30 LAB — CULTURE, BLOOD (ROUTINE X 2)
Culture: NO GROWTH
Culture: NO GROWTH
Special Requests: ADEQUATE

## 2022-08-01 LAB — OPIATE, QUANTITATIVE, URINE
OXYCODONE+OXYMORPHONE UR QL SCN: NEGATIVE
Opiates: NEGATIVE

## 2024-09-23 ENCOUNTER — Emergency Department (HOSPITAL_COMMUNITY): Admission: EM | Admit: 2024-09-23 | Payer: MEDICAID | Source: Home / Self Care

## 2024-09-23 ENCOUNTER — Other Ambulatory Visit: Payer: Self-pay

## 2024-09-23 ENCOUNTER — Encounter (HOSPITAL_COMMUNITY): Payer: Self-pay

## 2024-09-23 DIAGNOSIS — R1110 Vomiting, unspecified: Secondary | ICD-10-CM | POA: Diagnosis not present

## 2024-09-23 DIAGNOSIS — R1084 Generalized abdominal pain: Secondary | ICD-10-CM | POA: Insufficient documentation

## 2024-09-23 DIAGNOSIS — Z5321 Procedure and treatment not carried out due to patient leaving prior to being seen by health care provider: Secondary | ICD-10-CM | POA: Insufficient documentation

## 2024-09-23 LAB — COMPREHENSIVE METABOLIC PANEL WITH GFR
ALT: 15 U/L (ref 0–44)
AST: 26 U/L (ref 15–41)
Albumin: 5 g/dL (ref 3.5–5.0)
Alkaline Phosphatase: 78 U/L (ref 38–126)
Anion gap: 13 (ref 5–15)
BUN: 11 mg/dL (ref 6–20)
CO2: 24 mmol/L (ref 22–32)
Calcium: 9.9 mg/dL (ref 8.9–10.3)
Chloride: 99 mmol/L (ref 98–111)
Creatinine, Ser: 0.79 mg/dL (ref 0.61–1.24)
GFR, Estimated: >60 mL/min (ref >60)
Glucose, Bld: 107 mg/dL — ABNORMAL HIGH (ref 70–99)
Potassium: 4.4 mmol/L (ref 3.5–5.1)
Sodium: 136 mmol/L (ref 135–145)
Total Bilirubin: 1.2 mg/dL (ref 0.0–1.2)
Total Protein: 8.7 g/dL — ABNORMAL HIGH (ref 6.5–8.1)

## 2024-09-23 LAB — CBC
HCT: 49.4 % (ref 39.0–52.0)
Hemoglobin: 17.3 g/dL — ABNORMAL HIGH (ref 13.0–17.0)
MCH: 30.1 pg (ref 26.0–34.0)
MCHC: 35 g/dL (ref 30.0–36.0)
MCV: 86.1 fL (ref 80.0–100.0)
Platelets: 410 K/uL — ABNORMAL HIGH (ref 150–400)
RBC: 5.74 MIL/uL (ref 4.22–5.81)
RDW: 13.2 % (ref 11.5–15.5)
WBC: 8.1 K/uL (ref 4.0–10.5)
nRBC: 0 % (ref 0.0–0.2)

## 2024-09-23 LAB — LIPASE, BLOOD: Lipase: 14 U/L (ref 11–51)

## 2024-09-23 NOTE — ED Triage Notes (Signed)
 Reports generalized left sided abdominal pain best described as severe cramping that manifested earlier in the day. Followed with several episodes of vomiting.   Says as a teenager he was diagnosed with Intussusception and required surgical intervention.

## 2024-09-24 NOTE — ED Notes (Signed)
Pt left the building
